# Patient Record
Sex: Male | Born: 1955
Health system: Southern US, Community
[De-identification: ages and names within clinical notes are randomized; demographics above are authoritative.]

## PROBLEM LIST (undated history)

## (undated) DIAGNOSIS — J40 Bronchitis, not specified as acute or chronic: Secondary | ICD-10-CM

## (undated) DIAGNOSIS — R12 Heartburn: Secondary | ICD-10-CM

## (undated) DIAGNOSIS — N481 Balanitis: Secondary | ICD-10-CM

## (undated) DIAGNOSIS — G4730 Sleep apnea, unspecified: Secondary | ICD-10-CM

## (undated) HISTORY — PX: TONSILLECTOMY AND ADENOIDECTOMY: SHX28

## (undated) HISTORY — DX: Sleep apnea, unspecified: G47.30

## (undated) HISTORY — DX: Balanitis: N48.1

## (undated) HISTORY — DX: Heartburn: R12

---

## 2004-06-02 ENCOUNTER — Ambulatory Visit: Payer: Self-pay | Admitting: Neurology

## 2004-07-01 ENCOUNTER — Ambulatory Visit: Payer: Self-pay | Admitting: Neurology

## 2004-10-19 ENCOUNTER — Ambulatory Visit: Payer: Self-pay

## 2011-07-14 ENCOUNTER — Emergency Department (HOSPITAL_COMMUNITY): Payer: 59

## 2011-07-14 ENCOUNTER — Encounter: Payer: Self-pay | Admitting: Emergency Medicine

## 2011-07-14 ENCOUNTER — Emergency Department (HOSPITAL_COMMUNITY)
Admission: EM | Admit: 2011-07-14 | Discharge: 2011-07-14 | Disposition: A | Discharge status: Home / Self Care | Payer: 59 | Attending: Emergency Medicine | Admitting: Emergency Medicine

## 2011-07-14 ENCOUNTER — Other Ambulatory Visit: Payer: Self-pay

## 2011-07-14 DIAGNOSIS — Y990 Civilian activity done for income or pay: Secondary | ICD-10-CM | POA: Insufficient documentation

## 2011-07-14 DIAGNOSIS — R05 Cough: Secondary | ICD-10-CM | POA: Insufficient documentation

## 2011-07-14 DIAGNOSIS — S60229A Contusion of unspecified hand, initial encounter: Secondary | ICD-10-CM | POA: Insufficient documentation

## 2011-07-14 DIAGNOSIS — S0003XA Contusion of scalp, initial encounter: Secondary | ICD-10-CM | POA: Insufficient documentation

## 2011-07-14 DIAGNOSIS — R55 Syncope and collapse: Secondary | ICD-10-CM

## 2011-07-14 DIAGNOSIS — R112 Nausea with vomiting, unspecified: Secondary | ICD-10-CM | POA: Insufficient documentation

## 2011-07-14 DIAGNOSIS — R059 Cough, unspecified: Secondary | ICD-10-CM | POA: Insufficient documentation

## 2011-07-14 DIAGNOSIS — J3489 Other specified disorders of nose and nasal sinuses: Secondary | ICD-10-CM | POA: Insufficient documentation

## 2011-07-14 DIAGNOSIS — R404 Transient alteration of awareness: Secondary | ICD-10-CM | POA: Insufficient documentation

## 2011-07-14 DIAGNOSIS — R42 Dizziness and giddiness: Secondary | ICD-10-CM | POA: Insufficient documentation

## 2011-07-14 DIAGNOSIS — M79609 Pain in unspecified limb: Secondary | ICD-10-CM | POA: Insufficient documentation

## 2011-07-14 DIAGNOSIS — S0083XA Contusion of other part of head, initial encounter: Secondary | ICD-10-CM | POA: Insufficient documentation

## 2011-07-14 DIAGNOSIS — W1809XA Striking against other object with subsequent fall, initial encounter: Secondary | ICD-10-CM | POA: Insufficient documentation

## 2011-07-14 HISTORY — DX: Bronchitis, not specified as acute or chronic: J40

## 2011-07-14 LAB — POCT I-STAT TROPONIN I: Troponin i, poc: 0 ng/mL (ref 0.00–0.08)

## 2011-07-14 LAB — DIFFERENTIAL
Basophils Absolute: 0 10*3/uL (ref 0.0–0.1)
Basophils Relative: 0 % (ref 0–1)
Eosinophils Absolute: 0.1 10*3/uL (ref 0.0–0.7)
Monocytes Relative: 8 % (ref 3–12)
Neutro Abs: 10.7 10*3/uL — ABNORMAL HIGH (ref 1.7–7.7)
Neutrophils Relative %: 84 % — ABNORMAL HIGH (ref 43–77)

## 2011-07-14 LAB — POCT I-STAT, CHEM 8
BUN: 15 mg/dL (ref 6–23)
Calcium, Ion: 1.11 mmol/L — ABNORMAL LOW (ref 1.12–1.32)
Chloride: 106 meq/L (ref 96–112)
Creatinine, Ser: 1 mg/dL (ref 0.50–1.35)
Glucose, Bld: 102 mg/dL — ABNORMAL HIGH (ref 70–99)
HCT: 40 % (ref 39.0–52.0)
Hemoglobin: 13.6 g/dL (ref 13.0–17.0)
Potassium: 4.3 mEq/L (ref 3.5–5.1)
Sodium: 142 meq/L (ref 135–145)
TCO2: 27 mmol/L (ref 0–100)

## 2011-07-14 LAB — CBC
Hemoglobin: 13.9 g/dL (ref 13.0–17.0)
MCH: 31.4 pg (ref 26.0–34.0)
MCHC: 35.4 g/dL (ref 30.0–36.0)
Platelets: 177 10*3/uL (ref 150–400)
RDW: 12.2 % (ref 11.5–15.5)

## 2011-07-14 MED ORDER — PROMETHAZINE HCL 25 MG/ML IJ SOLN
12.5000 mg | Freq: Once | INTRAMUSCULAR | Status: AC
  Administered 2011-07-14: 12.5 mg via INTRAVENOUS
  Filled 2011-07-14 (×2): qty 1

## 2011-07-14 MED ORDER — HYDROCODONE-ACETAMINOPHEN 5-325 MG PO TABS: 2.0000 | ORAL_TABLET | ORAL | Status: AC | PRN

## 2011-07-14 MED ORDER — SODIUM CHLORIDE 0.9 % IV SOLN
Freq: Once | INTRAVENOUS | Status: AC
  Administered 2011-07-14: 11:00:00 via INTRAVENOUS

## 2011-07-14 MED ORDER — ONDANSETRON HCL 4 MG/2ML IJ SOLN
INTRAMUSCULAR | Status: AC
  Administered 2011-07-14: 10:00:00
  Filled 2011-07-14: qty 2

## 2011-07-14 MED ORDER — ONDANSETRON HCL 4 MG/2ML IJ SOLN
INTRAMUSCULAR | Status: AC
  Administered 2011-07-14: 4 mg
  Filled 2011-07-14: qty 2

## 2011-07-14 MED ORDER — PROMETHAZINE HCL 25 MG PO TABS: 25.0000 mg | ORAL_TABLET | Freq: Four times a day (QID) | ORAL | Status: AC | PRN

## 2011-07-14 MED ORDER — SODIUM CHLORIDE 0.9 % IV BOLUS (SEPSIS)
1000.0000 mL | Freq: Once | INTRAVENOUS | Status: AC
  Administered 2011-07-14: 1000 mL via INTRAVENOUS

## 2011-07-14 MED ORDER — MORPHINE SULFATE 4 MG/ML IJ SOLN
4.0000 mg | Freq: Once | INTRAMUSCULAR | Status: AC
  Administered 2011-07-14: 4 mg via INTRAVENOUS
  Filled 2011-07-14: qty 1

## 2011-07-14 NOTE — ED Notes (Signed)
Pt resting quietly with eyes closed, resp equal/nonlaboed.  Easily roused, warm blankets given.

## 2011-07-14 NOTE — ED Notes (Signed)
Wound care performed by NT [place] with saline.

## 2011-07-14 NOTE — ED Notes (Signed)
Pt resting semi fowlers position.  VSS, he denies N/V at this time.  PO trial attempted with pt who was given Sprite and crackers.

## 2011-07-14 NOTE — ED Provider Notes (Signed)
I have personally performed and participated in all the services and procedures documented herein. I have reviewed the findings with the patient.  Pt with recent illness, had not eaten or drank much fluid today, poor appetite, pt had no preceding CP, pleuritic pain, back or flank pain, continues to have no pain.  Briefly slumped over on desk.  Pt clinically improved with IVF's here.  Pt safe to be discharged home with close outpt follow up.  Gavin Pound. Oletta Lamas, MD 07/14/11 [phone]

## 2011-07-14 NOTE — ED Notes (Signed)
Per EMS:  Pt has had three episodes of emesis this morning.  Pt stated he has not had anything to eat today and is taking antibiotics for bronchitis.  He is unsure of what he is taking but stated he thinks it is a Z-pack, a nasal spray, and a cough syrup containing codeine.

## 2011-07-14 NOTE — ED Notes (Signed)
Pt c/o persistent nausea without emesis and headache.  PA notified.

## 2011-07-14 NOTE — ED Provider Notes (Signed)
History     CSN: [id number] Arrival date & time: 07/14/2011  9:06 AM   First MD Initiated Contact with Patient 07/14/11 0919      Chief Complaint  Patient presents with  . Loss of Consciousness    (Consider location/radiation/quality/duration/timing/severity/associated sxs/prior treatment) Patient is a 55 y.o. male presenting with syncope. The history is provided by the patient and a friend.  Loss of Consciousness This is a new problem. The current episode started today. Associated symptoms include congestion, coughing, nausea and vomiting. Pertinent negatives include no abdominal pain, anorexia, arthralgias, change in bowel habit, chest pain, chills, diaphoresis, fatigue, fever, headaches, joint swelling, myalgias, neck pain, numbness, rash, sore throat, swollen glands, urinary symptoms, vertigo, visual change or weakness. He has tried nothing for the symptoms.  He is currently being treated for bronchitis by his PCP; he was started on a Z-pack yesterday. He states that he took his medication this morning but had not eaten breakfast. He was at work this morning when he began to feel poorly - he felt a bit lightheaded. He denies associated CP, SOB, diaphoresis, n/v. He sat down at his desk and put his head down and apparently passed out. Coworkers did not actually see him pass out but heard him hit the ground; he was out for about 30 seconds. Witnesses report that he did not have any seizure like activity. He came to and was A+O and was able to talk with coworkers. He became nauseated after the event and vomited prior to ED arrival; he had persistent nausea upon arrival to the ED.  Past Medical History  Diagnosis Date  . Bronchitis     History reviewed. No pertinent past surgical history.  History reviewed. No pertinent family history.  History  Substance Use Topics  . Smoking status: Not on file  . Smokeless tobacco: Not on file  . Alcohol Use: No      Review of Systems    Constitutional: Negative for fever, chills, diaphoresis and fatigue.  HENT: Positive for congestion. Negative for ear pain, sore throat, neck pain and tinnitus.   Eyes: Negative for photophobia and visual disturbance.  Respiratory: Positive for cough. Negative for chest tightness, shortness of breath and wheezing.   Cardiovascular: Positive for syncope. Negative for chest pain.  Gastrointestinal: Positive for nausea and vomiting. Negative for abdominal pain, diarrhea, blood in stool, anorexia and change in bowel habit.  Musculoskeletal: Negative for myalgias, joint swelling and arthralgias.  Skin: Negative for color change and rash.  Neurological: Positive for syncope. Negative for dizziness, vertigo, tremors, seizures, facial asymmetry, speech difficulty, weakness, light-headedness, numbness and headaches.  Psychiatric/Behavioral: Negative for confusion.    Allergies  Penicillins  Home Medications  No current outpatient prescriptions on file.  BP 96/59  Pulse 61  Temp(Src) 97.7 F (36.5 C) (Oral)  Resp 15  SpO2 97%  Physical Exam  Nursing note and vitals reviewed. Constitutional: He is oriented to person, place, and time. He appears well-developed and well-nourished. No distress.  HENT:  Head: Normocephalic.  Right Ear: External ear normal.  Left Ear: External ear normal.  Nose: Nose normal.  Mouth/Throat: Oropharynx is clear and moist. No oropharyngeal exudate.       Small scalp hematoma noted just into hairline on R side of head. Abrasion and bruising lateral to R eye - pt says he wears glasses which caused the abrasion.  Eyes: Conjunctivae and EOM are normal. Pupils are equal, round, and reactive to light.  Neck: Normal range of  motion. Neck supple.  Cardiovascular: Normal rate, regular rhythm and normal heart sounds.  Exam reveals no gallop and no friction rub.   No murmur heard. Pulmonary/Chest: Effort normal and breath sounds normal. No respiratory distress. He has no  wheezes. He has no rales. He exhibits no tenderness.  Abdominal: Soft. Bowel sounds are normal. There is no tenderness. There is no guarding.  Musculoskeletal: Normal range of motion. He exhibits tenderness.       Bruising noted to MCP jt of L hand. Area is slightly ttp.  Neurological: He is alert and oriented to person, place, and time. No cranial nerve deficit. He exhibits normal muscle tone.  Skin: Skin is warm and dry. No rash noted. He is not diaphoretic.  Psychiatric: He has a normal mood and affect.    ED Course  Procedures (including critical care time)  Labs Reviewed  CBC - Abnormal; Notable for the following:    WBC 12.8 (*)    All other components within normal limits  DIFFERENTIAL - Abnormal; Notable for the following:    Neutrophils Relative 84 (*)    Neutro Abs 10.7 (*)    Lymphocytes Relative 8 (*)    All other components within normal limits  POCT I-STAT, CHEM 8 - Abnormal; Notable for the following:    Glucose, Bld 102 (*)    Calcium, Ion 1.11 (*)    All other components within normal limits  POCT I-STAT TROPONIN I  I-STAT TROPONIN I  I-STAT, CHEM 8  Minor WBC which I suspect is from his viral illness. Enzymes negative.  Dg Chest 2 View  07/14/2011  *RADIOLOGY REPORT*  Clinical Data: Syncopal episode, fell hitting head  CHEST - 2 VIEW  Comparison: None.  Findings: The lungs are clear although slightly hyperaerated. Mediastinal contours appear normal.  The heart is within normal limits in size.  No bony abnormality is seen.  IMPRESSION: No active lung disease.  Slight hyperaeration.  Original Report Authenticated By: Juline Patch, M.D.   Ct Head Wo Contrast  07/14/2011  *RADIOLOGY REPORT*  Clinical Data: Loss of consciousness.  CT HEAD WITHOUT CONTRAST  Technique:  Contiguous axial images were obtained from the base of the skull through the vertex without contrast.  Comparison: None  Findings: The ventricles are normal.  No extra-axial fluid collections are seen.   The brainstem and cerebellum are unremarkable.  No acute intracranial findings such as infarction or hemorrhage.  No mass lesions.  The bony calvarium is intact.  The visualized paranasal sinuses and mastoid air cells are clear.  IMPRESSION: No acute intracranial findings or skull fracture.  Original Report Authenticated By: P. Loralie Champagne, M.D.   Dg Hand 2 View Left  07/14/2011  *RADIOLOGY REPORT*  Clinical Data: Pain and swelling.  LEFT HAND - 2 VIEW  Comparison: None  Findings: The joint spaces are maintained.  No significant degenerative changes.  No acute bony findings.  IMPRESSION: No acute bony findings.  Original Report Authenticated By: P. Loralie Champagne, M.D.     Date: 07/14/2011  Rate: 60  Rhythm: normal sinus rhythm  QRS Axis: normal  Intervals: normal  ST/T Wave abnormalities: nonspecific ST changes  Conduction Disutrbances:none  Narrative Interpretation: diffuse nonspecific ST changes  Old EKG Reviewed: none available    1. Syncope       MDM  12:05 PM Patient seen and assessed. Small abrasion noted to face. A+Ox4. NAD. Neuro exam nonfocal, physical exam nl. Will obtain CT head as patient does have  scalp hematoma and has had persistent nausea and vomiting since the incident.  1:25 PM Patient's labs unremarkable. CXR and CT head nl. Patient states he feels somewhat better, not orthostatic. Will try PO trial.  2:00 PM Patient able to tolerate PO liquids but began to feel nauseous when beginning to eat crackers. Will remedicate with Phenergan and morphine.  3:31 PM Patient resting much more comfortably s/p phenergan. Will plan to d/c home at this point with pain medication. Workup essentially negative for acute worrisome etiology of syncope; enzymes, EKG nl, CT head nl, other labs nl. I suspect this may be related to being slightly dehydrated with his URI/bronchitis and taking the abx on an empty stomach. I explained the findings and plan to pt and wife; he was  instructed to keep a f/u appt that has already been made with an NP at his PCP's office. He was instructed re: warning signs that would prompt a return visit. They verbalized understanding and agreed to plan.    Grant Fontana, [place] 07/14/11 1640

## 2011-07-14 NOTE — ED Notes (Signed)
Physician assistant at [facility] for evaluation.

## 2011-07-14 NOTE — ED Notes (Signed)
Per EMS:  Pt was at work sitting down and co-workers saw him pass out landing face first on the floor

## 2011-07-14 NOTE — ED Notes (Signed)
Pt reports that he was at work when syncope occurred.  He does state that he vaguely remembers episode.  Coworkers report that he was unresponsive for approximately 1 minute.  Pt vommitted prior to ER arrival and c/o nausea upon arrival.  Emesis bag given.  Pt A?O x3, NAD, resp equal/nonlabored.  Pt denies dizziness and visual disturbance at this time.

## 2011-12-24 ENCOUNTER — Ambulatory Visit: Payer: Self-pay | Admitting: Family Medicine

## 2012-02-01 ENCOUNTER — Ambulatory Visit: Payer: Self-pay

## 2013-08-15 ENCOUNTER — Ambulatory Visit: Payer: Self-pay | Admitting: Family Medicine

## 2014-07-21 ENCOUNTER — Emergency Department: Payer: Self-pay | Admitting: Emergency Medicine

## 2014-10-23 LAB — CBC AND DIFFERENTIAL
HCT: 45 % (ref 41–53)
Hemoglobin: 16.3 g/dL (ref 13.5–17.5)
Neutrophils Absolute: 5 /uL
Platelets: 240 10*3/uL (ref 150–399)
WBC: 8.2 10^3/mL

## 2014-10-23 LAB — LIPID PANEL
Cholesterol: 222 mg/dL — AB (ref 0–200)
HDL: 46 mg/dL (ref 35–70)
LDL Cholesterol: 139 mg/dL
LDl/HDL Ratio: 3
Triglycerides: 186 mg/dL — AB (ref 40–160)

## 2014-10-23 LAB — HEPATIC FUNCTION PANEL
ALK PHOS: 64 U/L (ref 25–125)
ALT: 10 U/L (ref 10–40)
AST: 15 U/L (ref 14–40)
Bilirubin, Total: 0.8 mg/dL

## 2014-10-23 LAB — BASIC METABOLIC PANEL
BUN: 17 mg/dL (ref 4–21)
Creatinine: 1 mg/dL (ref 0.6–1.3)
GLUCOSE: 90 mg/dL
POTASSIUM: 4.3 mmol/L (ref 3.4–5.3)
SODIUM: 143 mmol/L (ref 137–147)

## 2014-10-23 LAB — PSA: PSA: 1.7

## 2014-10-23 LAB — TSH: TSH: 3.06 u[IU]/mL (ref 0.41–5.90)

## 2015-02-12 DIAGNOSIS — E291 Testicular hypofunction: Secondary | ICD-10-CM | POA: Insufficient documentation

## 2015-02-12 DIAGNOSIS — R55 Syncope and collapse: Secondary | ICD-10-CM | POA: Insufficient documentation

## 2015-02-12 DIAGNOSIS — K219 Gastro-esophageal reflux disease without esophagitis: Secondary | ICD-10-CM | POA: Insufficient documentation

## 2015-02-12 DIAGNOSIS — E299 Testicular dysfunction, unspecified: Secondary | ICD-10-CM | POA: Insufficient documentation

## 2015-02-12 DIAGNOSIS — F329 Major depressive disorder, single episode, unspecified: Secondary | ICD-10-CM | POA: Insufficient documentation

## 2015-02-12 DIAGNOSIS — E785 Hyperlipidemia, unspecified: Secondary | ICD-10-CM | POA: Insufficient documentation

## 2015-02-12 DIAGNOSIS — G4700 Insomnia, unspecified: Secondary | ICD-10-CM | POA: Insufficient documentation

## 2015-02-12 DIAGNOSIS — K449 Diaphragmatic hernia without obstruction or gangrene: Secondary | ICD-10-CM | POA: Insufficient documentation

## 2015-02-12 DIAGNOSIS — G4733 Obstructive sleep apnea (adult) (pediatric): Secondary | ICD-10-CM | POA: Insufficient documentation

## 2015-02-12 DIAGNOSIS — R0683 Snoring: Secondary | ICD-10-CM | POA: Insufficient documentation

## 2015-02-12 DIAGNOSIS — E559 Vitamin D deficiency, unspecified: Secondary | ICD-10-CM | POA: Insufficient documentation

## 2015-02-12 DIAGNOSIS — G500 Trigeminal neuralgia: Secondary | ICD-10-CM | POA: Insufficient documentation

## 2015-02-12 DIAGNOSIS — F32A Depression, unspecified: Secondary | ICD-10-CM | POA: Insufficient documentation

## 2015-02-12 DIAGNOSIS — Z7251 High risk heterosexual behavior: Secondary | ICD-10-CM | POA: Insufficient documentation

## 2015-02-12 DIAGNOSIS — A6000 Herpesviral infection of urogenital system, unspecified: Secondary | ICD-10-CM | POA: Insufficient documentation

## 2015-02-12 DIAGNOSIS — N529 Male erectile dysfunction, unspecified: Secondary | ICD-10-CM | POA: Insufficient documentation

## 2015-02-12 DIAGNOSIS — H81319 Aural vertigo, unspecified ear: Secondary | ICD-10-CM | POA: Insufficient documentation

## 2015-02-12 DIAGNOSIS — F320 Major depressive disorder, single episode, mild: Secondary | ICD-10-CM | POA: Insufficient documentation

## 2015-02-14 ENCOUNTER — Encounter: Payer: Self-pay | Admitting: Physician Assistant

## 2015-02-14 ENCOUNTER — Ambulatory Visit (INDEPENDENT_AMBULATORY_CARE_PROVIDER_SITE_OTHER): Payer: 59 | Admitting: Physician Assistant

## 2015-02-14 VITALS — BP 104/70 | HR 62 | Temp 97.5°F | Resp 16 | Wt 178.8 lb

## 2015-02-14 DIAGNOSIS — J302 Other seasonal allergic rhinitis: Secondary | ICD-10-CM | POA: Diagnosis not present

## 2015-02-14 DIAGNOSIS — R059 Cough, unspecified: Secondary | ICD-10-CM

## 2015-02-14 DIAGNOSIS — R05 Cough: Secondary | ICD-10-CM | POA: Diagnosis not present

## 2015-02-14 MED ORDER — PREDNISONE 10 MG PO TABS: ORAL_TABLET | ORAL | Status: DC

## 2015-02-14 MED ORDER — LORATADINE 10 MG PO TABS: 10.0000 mg | ORAL_TABLET | Freq: Every day | ORAL | Status: DC

## 2015-02-14 NOTE — Progress Notes (Signed)
Subjective:    Patient ID: Danny Ortiz, male    DOB: [birth year], 59 y.o.   MRN: [id number]  Cough This is a recurrent problem. The current episode started more than 1 month ago. The problem has been unchanged. The cough is non-productive. Associated symptoms include nasal congestion, postnasal drip, rhinorrhea and shortness of breath. Pertinent negatives include no chest pain, chills, ear congestion, ear pain, eye redness, fever, headaches, heartburn, hemoptysis, myalgias, rash, sore throat, sweats, weight loss or wheezing. The symptoms are aggravated by lying down. He has tried nothing for the symptoms. His past medical history is significant for asthma, bronchitis and environmental allergies. There is no history of bronchiectasis, COPD, emphysema or pneumonia.      Review of Systems  Constitutional: Negative for fever, chills, weight loss, activity change, appetite change, fatigue and unexpected weight change.  HENT: Positive for congestion, postnasal drip, rhinorrhea, sinus pressure and sneezing. Negative for ear discharge, ear pain, nosebleeds, sore throat, tinnitus, trouble swallowing and voice change.   Eyes: Negative for photophobia, discharge and redness.  Respiratory: Positive for cough, chest tightness and shortness of breath. Negative for hemoptysis and wheezing.   Cardiovascular: Negative for chest pain, palpitations and leg swelling.  Gastrointestinal: Negative for heartburn, nausea, vomiting, diarrhea and constipation.  Musculoskeletal: Negative for myalgias, back pain, arthralgias, neck pain and neck stiffness.  Skin: Negative for color change and rash.  Allergic/Immunologic: Positive for environmental allergies.  Neurological: Negative for dizziness, seizures, syncope, weakness, light-headedness and headaches.       Objective:   Physical Exam  Constitutional: He appears well-developed and well-nourished. No distress.  HENT:  Head: Normocephalic and atraumatic.   Right Ear: Hearing and external ear normal.  Left Ear: Hearing and external ear normal.  Nose: Mucosal edema and rhinorrhea present. No sinus tenderness. Right sinus exhibits no maxillary sinus tenderness and no frontal sinus tenderness. Left sinus exhibits no maxillary sinus tenderness and no frontal sinus tenderness.  Mouth/Throat: Uvula is midline, oropharynx is clear and moist and mucous membranes are normal. No oropharyngeal exudate.  (B) ears with excessive cerumen; Nasal passageways were enlarged and pale  Eyes: Conjunctivae are normal. Pupils are equal, round, and reactive to light. Right eye exhibits no discharge. Left eye exhibits no discharge. No scleral icterus.  Neck: Normal range of motion. Neck supple. No JVD present. No tracheal deviation present. No thyromegaly present.  Cardiovascular: Normal rate, regular rhythm, normal heart sounds and intact distal pulses.  Exam reveals no gallop and no friction rub.   No murmur heard. Pulmonary/Chest: Effort normal and breath sounds normal. No stridor. No respiratory distress. He has no wheezes. He has no rales. He exhibits no tenderness.  Lymphadenopathy:    He has no cervical adenopathy.  Skin: He is not diaphoretic.  Vitals reviewed.         Assessment & Plan:  1. Cough He preferred to try a prednisone taper over an inhaler due to ease of use.  Will try 6 day taper.  If no relief will get CXR and possibly try zpak with a second taper. - predniSONE (DELTASONE) 10 MG tablet; Take 6 tablets on day 1, 5 tablets on day 2, 4 tablets on day 3, 3 tablets on day 4, 2 tablets on day 5 and 1 tablet on day 6  Dispense: 21 tablet; Refill: 0  2. Seasonal allergies Uncontrolled.  Will add loratadine back to allergy regimen.  Advised to continue flonase.  If still with breakthrough allergy symptoms will add  singulair. - loratadine (CLARITIN) 10 MG tablet; Take 1 tablet (10 mg total) by mouth daily.  Dispense: 30 tablet; Refill: 3

## 2015-02-14 NOTE — Patient Instructions (Signed)

## 2015-04-08 ENCOUNTER — Other Ambulatory Visit: Payer: Self-pay | Admitting: Family Medicine

## 2015-05-02 ENCOUNTER — Telehealth: Payer: Self-pay | Admitting: Family Medicine

## 2015-05-02 NOTE — Telephone Encounter (Signed)
Spoke with patient and made appt for Tuesday to discuss Valtrex medication and rash issue-aa

## 2015-05-02 NOTE — Telephone Encounter (Signed)
Pt called wants to discuss one of the medications with someone.  He does not think it is working anymore.  He would like some one to call him today if possible.  Call back is  [phone].  Thanks [employer]

## 2015-05-06 ENCOUNTER — Encounter: Payer: Self-pay | Admitting: Family Medicine

## 2015-05-06 ENCOUNTER — Ambulatory Visit (INDEPENDENT_AMBULATORY_CARE_PROVIDER_SITE_OTHER): Payer: 59 | Admitting: Family Medicine

## 2015-05-06 VITALS — BP 102/60 | HR 74 | Temp 97.6°F | Resp 16 | Wt 177.0 lb

## 2015-05-06 DIAGNOSIS — A6000 Herpesviral infection of urogenital system, unspecified: Secondary | ICD-10-CM

## 2015-05-06 MED ORDER — ACYCLOVIR 5 % EX CREA: 1.0000 "application " | TOPICAL_CREAM | CUTANEOUS | Status: DC

## 2015-05-06 MED ORDER — VALACYCLOVIR HCL 500 MG PO TABS: 500.0000 mg | ORAL_TABLET | Freq: Three times a day (TID) | ORAL | Status: DC

## 2015-05-06 NOTE — Progress Notes (Signed)
Patient ID: Danny Ortiz, male   DOB: Aug 08, 1956, 59 y.o.   MRN: [id number]    Subjective:  HPI Pt reports that he has a rash on his penis and has been going on for about 6 months. He reports that the rash is not raised up and is not pimple like. It is just a red rash. Does not itch but does have a little dull ache. He does have a history of herpes but reports that this does not look like herpes and he has taken Valtrex and it has not worked. Denies dysuria or any urinary symptoms.   Prior to Admission medications   Medication Sig Start Date End Date Taking? Authorizing Provider  fluticasone (FLONASE) 50 MCG/ACT nasal spray Place 2 sprays into the nose daily.     Yes Historical Provider, MD  omeprazole (PRILOSEC) 20 MG capsule Take 20 mg by mouth daily.     Yes Historical Provider, MD  sildenafil (REVATIO) 20 MG tablet Take 20-100 mg by mouth daily as needed.   Yes Historical Provider, MD  valACYclovir (VALTREX) 500 MG tablet TAKE 1 TABLET BY MOUTH TWICE A DAY FOR OUTBREAK 04/08/15  Yes Jerrol Banana., MD  zolpidem (AMBIEN) 10 MG tablet Take 5 mg by mouth at bedtime as needed for sleep.   Yes Historical Provider, MD  loratadine (CLARITIN) 10 MG tablet Take 1 tablet (10 mg total) by mouth daily. Patient not taking: Reported on 05/06/2015 02/14/15   Mar Daring, PA-C    Patient Active Problem List   Diagnosis Date Noted  . Seasonal allergies 02/14/2015  . Clinical depression 02/12/2015  . Testicular dysfunction 02/12/2015  . ED (erectile dysfunction) of organic origin 02/12/2015  . Acid reflux 02/12/2015  . Genital herpes 02/12/2015  . 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 High risk sexual behavior 02/12/2015  . HLD (hyperlipidemia) 02/12/2015  . 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 Cannot sleep 02/12/2015  . Auditory vertigo 02/12/2015  . Mild major depression 02/12/2015  . Obstructive apnea 02/12/2015  . Snores 02/12/2015  . Episode of syncope 02/12/2015  . Trigeminal neuralgia  02/12/2015  . Avitaminosis D 02/12/2015    Past Medical History  Diagnosis Date  . Bronchitis     Social History   Social History  . Marital Status: Married    Spouse Name: N/A  . Number of Children: N/A  . Years of Education: N/A   Occupational History  . Not on file.   Social History Main Topics  . Smoking status: Never Smoker   . Smokeless tobacco: Not on file  . Alcohol Use: 0.0 oz/week    0 Standard drinks or equivalent per week     Comment: OCCASIONALLY  . Drug Use: No  . Sexual Activity: No   Other Topics Concern  . Not on file   Social History Narrative    Allergies  Allergen Reactions  . Penicillins Nausea And Vomiting  . Wellbutrin [Bupropion] Other (See Comments)    Jittery/shakes, dry mouth, felt bad    Review of Systems  Constitutional: Negative.   HENT: Negative.   Eyes: Negative.   Respiratory: Negative.   Cardiovascular: Negative.   Gastrointestinal: Negative.   Genitourinary: Negative.   Musculoskeletal: Negative.   Skin: Positive for rash (on penis).  Neurological: Negative.   Endo/Heme/Allergies: Negative.   Psychiatric/Behavioral: Negative.     Immunization History  Administered Date(s) Administered  . Td 09/11/2004  . Tdap 10/10/2014   Objective:  BP 102/60 mmHg  Pulse 74  Temp(Src) 97.6 F (36.4 C) (Oral)  Resp 16  Wt 177 lb (80.287 kg)  Physical Exam  Constitutional: He is well-developed, well-nourished, and in no distress.  HENT:  Head: Normocephalic and atraumatic.  Right Ear: External ear normal.  Left Ear: External ear normal.  Nose: Nose normal.  Eyes: Conjunctivae and EOM are normal. Pupils are equal, round, and reactive to light.  Neck: Normal range of motion. Neck supple.  Cardiovascular: Normal rate, regular rhythm, normal heart sounds and intact distal pulses.   Pulmonary/Chest: Effort normal and breath sounds normal.  Abdominal:  No inguinal adenopathy.  Genitourinary: Penis normal.  On the other on  the underside of the shaft of the penis just below the glans penis is a mildly erythematous, nonindurated area that is non-papular nonvesicular. It has the appearance of what I've seen in the past of a healing herpetic lesion. It is not Monilial.    Lab Results  Component Value Date   WBC 12.8* 07/14/2011   HGB 13.6 07/14/2011   HCT 40.0 07/14/2011   PLT 177 07/14/2011   GLUCOSE 102* 07/14/2011    CMP     Component Value Date/Time   NA 142 07/14/2011 1022   K 4.3 07/14/2011 1022   CL 106 07/14/2011 1022   GLUCOSE 102* 07/14/2011 1022   BUN 15 07/14/2011 1022   CREATININE 1.00 07/14/2011 1022    Assessment and Plan :  1. Herpes genitalia Most likely this is a healing herpetic outbreak. Increase Valtrex to 3 times a day for a week and refill the topical acyclovir as he says it helps him symptomatically. I'm not sure why this was helped symptoms but it is safe enough to do so. Go ahead and refer to dermatology as patient is worried about this issue. He denies significant or aggressive sexual encounters. No sign of STI. - valACYclovir (VALTREX) 500 MG tablet; Take 1 tablet (500 mg total) by mouth 3 (three) times daily.  Dispense: 30 tablet; Refill: 12 - acyclovir cream (ZOVIRAX) 5 %; Apply 1 application topically every 3 (three) hours. PRN  Dispense: 45 g; Refill: 1  2. Depression Patient is grieving the loss of his marriage. He has put off the divorce as long as he could. This will occur next spring. I think after that he will begin to recover. No suicidal or homicidal ideation. He states the depression is fine.  Miguel Aschoff MD [facility] 05/06/2015 9:29 AM

## 2015-05-07 NOTE — Telephone Encounter (Signed)
There is no generic to my knowledge for the cream. You can try generic acyclovir to be sent in. Thanks

## 2015-05-07 NOTE — Telephone Encounter (Signed)
Patient advised per Dr. Rosanna Randy that Acyclovir generic was the medication sent to pharmacy on 9/6. Patient states medication is $650 out of pocket. Advised patient that Valtrex is the same treatment per Dr. Rosanna Randy to continue with the Valtrex.

## 2015-05-07 NOTE — Telephone Encounter (Signed)
Pt states that medication that was called in for rash was to expensive.Can you call in generic for it ? If not pt wants a different medication called into [facility] [address]

## 2015-06-15 ENCOUNTER — Other Ambulatory Visit: Payer: Self-pay | Admitting: Family Medicine

## 2015-06-16 NOTE — Telephone Encounter (Signed)
Controlled, need you to authorize  thanks

## 2015-09-04 ENCOUNTER — Other Ambulatory Visit: Payer: Self-pay

## 2015-10-22 ENCOUNTER — Encounter: Payer: Self-pay | Admitting: Family Medicine

## 2015-11-19 ENCOUNTER — Encounter: Payer: Self-pay | Admitting: Family Medicine

## 2015-11-19 ENCOUNTER — Ambulatory Visit (INDEPENDENT_AMBULATORY_CARE_PROVIDER_SITE_OTHER): Payer: 59 | Admitting: Family Medicine

## 2015-11-19 VITALS — BP 102/58 | HR 64 | Temp 97.5°F | Resp 18 | Ht 70.0 in | Wt 181.0 lb

## 2015-11-19 DIAGNOSIS — Z0000 Encounter for general adult medical examination without abnormal findings: Secondary | ICD-10-CM

## 2015-11-19 DIAGNOSIS — N481 Balanitis: Secondary | ICD-10-CM | POA: Diagnosis not present

## 2015-11-19 DIAGNOSIS — E291 Testicular hypofunction: Secondary | ICD-10-CM

## 2015-11-19 LAB — POCT URINALYSIS DIPSTICK
Bilirubin, UA: NEGATIVE
GLUCOSE UA: NEGATIVE
Ketones, UA: NEGATIVE
LEUKOCYTES UA: NEGATIVE
NITRITE UA: NEGATIVE
Protein, UA: NEGATIVE
Spec Grav, UA: 1.02
UROBILINOGEN UA: 0.2
pH, UA: 6.5

## 2015-11-19 NOTE — Progress Notes (Signed)
Patient ID: Danny Ortiz, male   DOB: 07/20/1956, 60 y.o.   MRN: [id number]       Patient: Danny Ortiz, Male    DOB: 27-Apr-1956, 60 y.o.   MRN: [id number] Visit Date: 11/19/2015  Today's Provider: Wilhemena Durie, MD   Chief Complaint  Patient presents with  . Annual Exam   Subjective:    Annual physical exam Danny Ortiz is a 60 y.o. male who presents today for health maintenance and complete physical. He feels fairly well. He reports he is not exercising. He reports he is sleeping fairly well with 1/2 of Ambien. Finalizing divorce with wife. Patient has a son and daughter. Both are doing okay. He continues to work full-time. ----------------------------------------------------------------- Colonoscopy- 10/19/14 normal repeat 10 years Endoscopy- 10/19/14 hiatal hernia  Current Exercise Habits: The patient does not participate in regular exercise at present    He wants to wait on the Zoster vaccine, he has a 37 month old granddaughter.    Immunization History  Administered Date(s) Administered  . Td 09/11/2004  . Tdap 10/10/2014     Review of Systems  Constitutional: Negative.   HENT: Positive for sneezing.   Eyes: Negative.   Respiratory: Negative.   Cardiovascular: Negative.   Gastrointestinal: Negative.   Endocrine: Negative.   Genitourinary: Negative.   Musculoskeletal: Positive for arthralgias.  Skin: Negative.   Allergic/Immunologic: Negative.   Neurological: Negative.   Hematological: Negative.   Psychiatric/Behavioral: Negative.     Social History      He  reports that he has never smoked. He does not have any smokeless tobacco history on file. He reports that he drinks alcohol. He reports that he does not use illicit drugs.       Social History   Social History  . Marital Status: Married    Spouse Name: N/A  . Number of Children: N/A  . Years of Education: N/A   Social History Main Topics  . Smoking status: Never Smoker   . Smokeless  tobacco: None  . Alcohol Use: 0.0 oz/week    0 Standard drinks or equivalent per week     Comment: OCCASIONALLY  . Drug Use: No  . Sexual Activity: No   Other Topics Concern  . None   Social History Narrative    Past Medical History  Diagnosis Date  . Bronchitis      Patient Active Problem List   Diagnosis Date Noted  . Seasonal allergies 02/14/2015  . Clinical depression 02/12/2015  . Testicular dysfunction 02/12/2015  . ED (erectile dysfunction) of organic origin 02/12/2015  . Acid reflux 02/12/2015  . Genital herpes 02/12/2015  . 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 High risk sexual behavior 02/12/2015  . HLD (hyperlipidemia) 02/12/2015  . 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 Cannot sleep 02/12/2015  . Auditory vertigo 02/12/2015  . Mild major depression ([place]) 02/12/2015  . Obstructive apnea 02/12/2015  . Snores 02/12/2015  . Episode of syncope 02/12/2015  . Trigeminal neuralgia 02/12/2015  . Avitaminosis D 02/12/2015    Past Surgical History  Procedure Laterality Date  . Tonsillectomy and adenoidectomy      Family History        Family Status  Relation Status Death Age  . Mother Alive   . Father Alive   . Brother Alive   . Brother Alive   . Brother Alive         His family history includes Colon polyps in his maternal grandmother; Heart disease in his mother.  Allergies  Allergen Reactions  . Penicillins Nausea And Vomiting  . Wellbutrin [Bupropion] Other (See Comments)    Jittery/shakes, dry mouth, felt bad    Previous Medications   ACYCLOVIR CREAM (ZOVIRAX) 5 %    Apply 1 application topically every 3 (three) hours. PRN   FLUTICASONE (FLONASE) 50 MCG/ACT NASAL SPRAY    Place 2 sprays into the nose daily.     LORATADINE (CLARITIN) 10 MG TABLET    Take 1 tablet (10 mg total) by mouth daily.   OMEPRAZOLE (PRILOSEC) 20 MG CAPSULE    Take 20 mg by mouth daily.     SILDENAFIL (REVATIO) 20 MG TABLET    Take 20-100 mg by mouth daily as needed.   VALACYCLOVIR  (VALTREX) 500 MG TABLET    Take 1 tablet (500 mg total) by mouth 3 (three) times daily.   ZOLPIDEM (AMBIEN) 10 MG TABLET    TAKE 1/2 TABLET BY MOUTH AT BEDTIME    Patient Care Team: Jerrol Banana., MD as PCP - General (Family Medicine)     Objective:   Vitals: There were no vitals taken for this visit.   Physical Exam  Constitutional: He is oriented to person, place, and time. He appears well-developed and well-nourished.  HENT:  Head: Normocephalic and atraumatic.  Right Ear: External ear normal.  Left Ear: External ear normal.  Nose: Nose normal.  Mouth/Throat: Oropharynx is clear and moist.  Mild bilateral cerumen impaction.  Eyes: Conjunctivae and EOM are normal. Pupils are equal, round, and reactive to light.  Neck: Normal range of motion. Neck supple.  Cardiovascular: Normal rate, regular rhythm, normal heart sounds and intact distal pulses.   Pulmonary/Chest: Effort normal and breath sounds normal.  Abdominal: Soft. Bowel sounds are normal.  Genitourinary: Rectum normal, prostate normal and penis normal.  Musculoskeletal: Normal range of motion.  Neurological: He is alert and oriented to person, place, and time. He has normal reflexes.  Skin: Skin is warm and dry.  Psychiatric: He has a normal mood and affect. His behavior is normal. Judgment and thought content normal.     Depression Screen PHQ 2/9 Scores 05/06/2015  PHQ - 2 Score 0      Assessment & Plan:     Routine Health Maintenance and Physical Exam  Exercise Activities and Dietary recommendations Goals    None      Immunization History  Administered Date(s) Administered  . Td 09/11/2004  . Tdap 10/10/2014    Health Maintenance  Topic Date Due  . Hepatitis C Screening  [birth year]  . HIV Screening  04/15/1969  . COLONOSCOPY  04/15/2004  . ZOSTAVAX  04/15/2014  . INFLUENZA VACCINE  09/03/2016 (Originally 03/31/2015)  . TETANUS/TDAP  10/10/2024      Discussed health benefits of  physical activity, and encouraged him to engage in regular exercise appropriate for his age and condition.   I have done the exam and reviewed the above chart and it is accurate to the best of my knowledge.  --------------------------------------------------------------------

## 2015-11-19 NOTE — Patient Instructions (Signed)
For ear wax use over the counter Debrox as directed on the box.

## 2015-11-20 LAB — CBC WITH DIFFERENTIAL/PLATELET
BASOS: 1 %
Basophils Absolute: 0.1 10*3/uL (ref 0.0–0.2)
EOS (ABSOLUTE): 0.3 10*3/uL (ref 0.0–0.4)
EOS: 4 %
HEMOGLOBIN: 15 g/dL (ref 12.6–17.7)
Hematocrit: 42.6 % (ref 37.5–51.0)
IMMATURE GRANULOCYTES: 0 %
Immature Grans (Abs): 0 10*3/uL (ref 0.0–0.1)
LYMPHS ABS: 1.6 10*3/uL (ref 0.7–3.1)
Lymphs: 24 %
MCH: 32.6 pg (ref 26.6–33.0)
MCHC: 35.2 g/dL (ref 31.5–35.7)
MCV: 93 fL (ref 79–97)
MONOCYTES: 9 %
MONOS ABS: 0.6 10*3/uL (ref 0.1–0.9)
Neutrophils Absolute: 4.2 10*3/uL (ref 1.4–7.0)
Neutrophils: 62 %
Platelets: 210 10*3/uL (ref 150–379)
RBC: 4.6 x10E6/uL (ref 4.14–5.80)
RDW: 13.6 % (ref 12.3–15.4)
WBC: 6.8 10*3/uL (ref 3.4–10.8)

## 2015-11-20 LAB — COMPREHENSIVE METABOLIC PANEL
A/G RATIO: 2.3 — AB (ref 1.2–2.2)
ALBUMIN: 4.6 g/dL (ref 3.6–4.8)
ALK PHOS: 61 IU/L (ref 39–117)
ALT: 12 IU/L (ref 0–44)
AST: 17 IU/L (ref 0–40)
BUN / CREAT RATIO: 22 (ref 10–22)
BUN: 20 mg/dL (ref 8–27)
Bilirubin Total: 1 mg/dL (ref 0.0–1.2)
CHLORIDE: 102 mmol/L (ref 96–106)
CO2: 28 mmol/L (ref 18–29)
CREATININE: 0.89 mg/dL (ref 0.76–1.27)
Calcium: 9.3 mg/dL (ref 8.6–10.2)
GFR calc Af Amer: 107 mL/min/{1.73_m2} (ref >59)
GFR calc non Af Amer: 92 mL/min/{1.73_m2} (ref >59)
GLOBULIN, TOTAL: 2 g/dL (ref 1.5–4.5)
Glucose: 89 mg/dL (ref 65–99)
POTASSIUM: 4.1 mmol/L (ref 3.5–5.2)
SODIUM: 145 mmol/L — AB (ref 134–144)
Total Protein: 6.6 g/dL (ref 6.0–8.5)

## 2015-11-20 LAB — LIPID PANEL WITH LDL/HDL RATIO
Cholesterol, Total: 215 mg/dL — ABNORMAL HIGH (ref 100–199)
HDL: 41 mg/dL (ref >39)
LDL CALC: 140 mg/dL — AB (ref 0–99)
LDl/HDL Ratio: 3.4 ratio units (ref 0.0–3.6)
TRIGLYCERIDES: 171 mg/dL — AB (ref 0–149)
VLDL Cholesterol Cal: 34 mg/dL (ref 5–40)

## 2015-11-20 LAB — TSH: TSH: 1.66 u[IU]/mL (ref 0.450–4.500)

## 2015-11-20 LAB — PROLACTIN: PROLACTIN: 26.8 ng/mL — AB (ref 4.0–15.2)

## 2015-11-20 LAB — PSA: Prostate Specific Ag, Serum: 1.8 ng/mL (ref 0.0–4.0)

## 2015-11-20 LAB — TESTOSTERONE: Testosterone: 304 ng/dL — ABNORMAL LOW (ref 348–1197)

## 2015-11-24 ENCOUNTER — Telehealth: Payer: Self-pay

## 2015-11-24 DIAGNOSIS — E229 Hyperfunction of pituitary gland, unspecified: Principal | ICD-10-CM

## 2015-11-24 DIAGNOSIS — R7989 Other specified abnormal findings of blood chemistry: Secondary | ICD-10-CM

## 2015-11-24 NOTE — Telephone Encounter (Signed)
Elevated prolactin could be a pituitary adenoma which is a benign type of brain tumor. I think it will be a normal workup but I would do this because  overall he has good health.

## 2015-11-24 NOTE — Telephone Encounter (Signed)
-----   Message from Jerrol Banana., MD sent at 11/21/2015  2:17 PM EDT ----- Everything stable testosterone slightly low and prolactin level somewhat elevated. Refer back to endocrinology for follow-up of prolactin

## 2015-11-24 NOTE — Telephone Encounter (Signed)
Advised patient as below. Patient doesn't remember why he went to the endocrinologist. Can you explain why elevated prolactin level needs to have a work up? Also, which endocrinologist would you recommend? Please advise. Thanks!

## 2015-11-25 DIAGNOSIS — R7989 Other specified abnormal findings of blood chemistry: Secondary | ICD-10-CM | POA: Insufficient documentation

## 2015-11-25 DIAGNOSIS — E229 Hyperfunction of pituitary gland, unspecified: Principal | ICD-10-CM

## 2015-11-25 NOTE — Telephone Encounter (Signed)
Advised pt. Referred to [facility]. Renaldo Fiddler, CMA

## 2015-12-05 ENCOUNTER — Ambulatory Visit: Payer: 59

## 2015-12-08 ENCOUNTER — Encounter: Payer: Self-pay | Admitting: Urology

## 2015-12-08 ENCOUNTER — Ambulatory Visit (INDEPENDENT_AMBULATORY_CARE_PROVIDER_SITE_OTHER): Payer: 59 | Admitting: Urology

## 2015-12-08 VITALS — BP 113/70 | HR 58 | Ht 70.0 in | Wt 179.1 lb

## 2015-12-08 DIAGNOSIS — N481 Balanitis: Secondary | ICD-10-CM | POA: Diagnosis not present

## 2015-12-08 DIAGNOSIS — N528 Other male erectile dysfunction: Secondary | ICD-10-CM | POA: Diagnosis not present

## 2015-12-08 DIAGNOSIS — N529 Male erectile dysfunction, unspecified: Secondary | ICD-10-CM

## 2015-12-08 DIAGNOSIS — Z125 Encounter for screening for malignant neoplasm of prostate: Secondary | ICD-10-CM

## 2015-12-08 DIAGNOSIS — N4889 Other specified disorders of penis: Secondary | ICD-10-CM | POA: Insufficient documentation

## 2015-12-08 LAB — URINALYSIS, COMPLETE
Bilirubin, UA: NEGATIVE
GLUCOSE, UA: NEGATIVE
Ketones, UA: NEGATIVE
Leukocytes, UA: NEGATIVE
NITRITE UA: NEGATIVE
PH UA: 6 (ref 5.0–7.5)
Protein, UA: NEGATIVE
Specific Gravity, UA: 1.025 (ref 1.005–1.030)
Urobilinogen, Ur: 0.2 mg/dL (ref 0.2–1.0)

## 2015-12-08 LAB — MICROSCOPIC EXAMINATION
BACTERIA UA: NONE SEEN
EPITHELIAL CELLS (NON RENAL): NONE SEEN /HPF (ref 0–10)
WBC UA: NONE SEEN /HPF (ref 0–?)

## 2015-12-08 NOTE — Progress Notes (Signed)
12/08/2015 11:59 AM   Danny Ortiz 05-11-1956 [id number]  Referring provider: Jerrol Ortiz., MD [address] [place], [place] [address]  Chief Complaint  Patient presents with  . Balanitis    referred by Dr. Rosanna Ortiz    HPI:  1- Penile Irritation / Herpetic Neuropathy - pt with bothersome penile burning / itching sensation at area of frenulum x few mos. Had derm eval including biopsy that was negative for neoplasm. Non-diabetic. Circumcised. Non-smoker. He has h/o herpes with occasional outbreak especially with stress, now much improved on Valcyte supression. Exam completely unremarkable. No phimosis / clinical fungal balanitis / mass / scaly erythema.  Overall bother modest and now managing with OTC prns.  2 - Prostate Screening - pt's grandfather with lethal prostate cancer 2017 - PSA 1.8 / DRE 45gm smooth  3 - Erectile Dysfunction - on sildenafil 60-100mg  with satisfactio for difficulty maintaining erection. Libido preserved.  Today "Danny Ortiz" is seen in consulatation for above, specifically for furthe revaluation and manamgememt of penile irritation. He is referred by Dr. Rosanna Ortiz.    PMH: Past Medical History  Diagnosis Date  . Bronchitis   . Balanitis   . Heartburn   . Sleep apnea     Surgical History: Past Surgical History  Procedure Laterality Date  . Tonsillectomy and adenoidectomy      Home Medications:    Medication List       This list is accurate as of: 12/08/15 11:59 AM.  Always use your most recent med list.               acyclovir cream 5 %  Commonly known as:  ZOVIRAX  Apply 1 application topically every 3 (three) hours. PRN     fluticasone 50 MCG/ACT nasal spray  Commonly known as:  FLONASE  Place 2 sprays into the nose daily.     loratadine 10 MG tablet  Commonly known as:  CLARITIN  Take 1 tablet (10 mg total) by mouth daily.     omeprazole 20 MG capsule  Commonly known as:  PRILOSEC  Take 20 mg by mouth daily.      sildenafil 20 MG tablet  Commonly known as:  REVATIO  Take 20-100 mg by mouth daily as needed.     valACYclovir 500 MG tablet  Commonly known as:  VALTREX  Take 1 tablet (500 mg total) by mouth 3 (three) times daily.     zolpidem 10 MG tablet  Commonly known as:  AMBIEN  TAKE 1/2 TABLET BY MOUTH AT BEDTIME        Allergies:  Allergies  Allergen Reactions  . Penicillins Nausea And Vomiting  . Wellbutrin [Bupropion] Other (See Comments)    Jittery/shakes, dry mouth, felt bad    Family History: Family History  Problem Relation Age of Onset  . Heart disease Mother   . Colon polyps Maternal Grandmother   . Kidney disease Neg Hx   . Prostate cancer Maternal Grandfather     Social History:  reports that he has never smoked. He does not have any smokeless tobacco history on file. He reports that he drinks alcohol. He reports that he does not use illicit drugs.  ROS: UROLOGY Frequent Urination?: No Hard to postpone urination?: No Burning/pain with urination?: No Get up at night to urinate?: Yes Leakage of urine?: No Urine stream starts and stops?: No Trouble starting stream?: No Do you have to strain to urinate?: No Blood in urine?: No Urinary tract  infection?: No Sexually transmitted disease?: No Injury to kidneys or bladder?: No Painful intercourse?: No Weak stream?: No Erection problems?: No Penile pain?: No  Gastrointestinal Nausea?: No Vomiting?: No Indigestion/heartburn?: No Diarrhea?: No Constipation?: No  Constitutional Fever: No Night sweats?: No Weight loss?: No Fatigue?: No  Skin Skin rash/lesions?: No Itching?: No  Eyes Blurred vision?: No Double vision?: No  Ears/Nose/Throat Sore throat?: No Sinus problems?: No  Hematologic/Lymphatic Swollen glands?: No Easy bruising?: No  Cardiovascular Leg swelling?: No Chest pain?: No  Respiratory Cough?: No Shortness of breath?: No  Endocrine Excessive thirst?:  No  Musculoskeletal Back pain?: No Joint pain?: No  Neurological Headaches?: No Dizziness?: No  Psychologic Depression?: No Anxiety?: No  Physical Exam: BP 113/70 mmHg  Pulse 58  Ht 5\' 10"  (1.778 m)  Wt 179 lb 1.6 oz (81.239 kg)  BMI 25.70 kg/m2  Constitutional:  Alert and oriented, No acute distress. HEENT: [place] AT, moist mucus membranes.  Trachea midline, no masses. Cardiovascular: No clubbing, cyanosis, or edema. Respiratory: Normal respiratory effort, no increased work of breathing. GI: Abdomen is soft, nontender, nondistended, no abdominal masses GU: No CVA tenderness. DRE 45gm smooth. Phallus straight / cric'd. Area of sensatoin on frenulum w/o mass, erythema, scalyness, whatsoever.  Skin: No rashes, bruises or suspicious lesions. Lymph: No cervical or inguinal adenopathy. Neurologic: Grossly intact, no focal deficits, moving all 4 extremities. Psychiatric: Normal mood and affect.  Laboratory Data: Lab Results  Component Value Date   WBC 6.8 11/19/2015   HGB 16.3 10/23/2014   HCT 42.6 11/19/2015   MCV 93 11/19/2015   PLT 210 11/19/2015    Lab Results  Component Value Date   CREATININE 0.89 11/19/2015    Lab Results  Component Value Date   PSA 1.7 10/23/2014    Lab Results  Component Value Date   TESTOSTERONE 304* 11/19/2015    No results found for: HGBA1C  Urinalysis    Component Value Date/Time   APPEARANCEUR Clear 12/08/2015 1127   GLUCOSEU Negative 12/08/2015 1127   BILIRUBINUR Negative 12/08/2015 1127   BILIRUBINUR neg 11/19/2015 1048   PROTEINUR Negative 12/08/2015 1127   PROTEINUR neg 11/19/2015 1048   UROBILINOGEN 0.2 11/19/2015 1048   NITRITE Negative 12/08/2015 1127   NITRITE neg 11/19/2015 1048   LEUKOCYTESUR Negative 12/08/2015 1127   LEUKOCYTESUR Negative 11/19/2015 1048    Pertinent Imaging: none  Assessment & Plan:    1- Penile Irritation / Herpetic Neuropathy - exam reassuring. No worriseom mass / erythema that would  suggests neoplasm, BX also negative as well. No phimosis or fungal balanitis. Suspect stigmata of herpetic neuropathy. Agree with Valcyte supressoin. Discussed management options from very conservative (prn OTC moisturizer / itching or sun burn ointments for symptom management) to more aggressive with trial of lyrica or other neuropathy meds and he opts for conservative measures. I agree. Rec against topical steroids as may actually promote herpetic outbreak. He was mostly reassured. Strongly suggested repeat eval if any scaly / bleeding erythema or wart-like or mass-like areas develop.  2 - Prostate Screening - up to date this year. Rec continue yearly screening until age 59-75 given family history and overal excellent health.   3 - Erectile Dysfunction - doing very well on prn PDE5i, he is on one of safest / cost-effective meds.   4 - RTC Urol prn.   Return if symptoms worsen or fail to improve.  Alexis Frock, [facility] [address] [place], [place] [address] [phone]

## 2016-01-12 ENCOUNTER — Other Ambulatory Visit: Payer: Self-pay | Admitting: Family Medicine

## 2016-01-12 NOTE — Telephone Encounter (Signed)
Pt contacted office for refill request on the following medications: Sildenafil Citrate 20 mg to [facility]. Last written: 11/27/14 with no refills Last OV: 11/19/15 Please advise. Thanks TNP

## 2016-01-13 MED ORDER — SILDENAFIL CITRATE 20 MG PO TABS: 20.0000 mg | ORAL_TABLET | Freq: Every day | ORAL | Status: DC | PRN

## 2016-01-13 NOTE — Telephone Encounter (Signed)
Med sent into the pharmacy.  

## 2016-01-13 NOTE — Telephone Encounter (Signed)
Pt called wanting to check on RX.  To see if it was ok'ed.  Thanks, [employer]

## 2016-01-13 NOTE — Telephone Encounter (Signed)
Please review-aa 

## 2016-01-13 NOTE — Telephone Encounter (Signed)
Pt advised, RX sent in-aa 

## 2016-01-13 NOTE — Telephone Encounter (Signed)
Ok to rf--#[id number] thanks

## 2016-01-28 ENCOUNTER — Other Ambulatory Visit: Payer: Self-pay | Admitting: Family Medicine

## 2016-02-16 ENCOUNTER — Telehealth: Payer: Self-pay | Admitting: Family Medicine

## 2016-02-16 DIAGNOSIS — A6000 Herpesviral infection of urogenital system, unspecified: Secondary | ICD-10-CM

## 2016-02-16 NOTE — Telephone Encounter (Signed)
Please review-aa 

## 2016-02-16 NOTE — Telephone Encounter (Signed)
Pt called in needing refill on his valACYclovir (VALTREX) 500 MG tablet  He wants to increase the dosage to 1000mg .  He says he feels like it just not strong as he needs.  He said he has been doubling the dosage.  He uses [facility].  Pt's call back is [phone]  Thanks Con [employer]

## 2016-02-18 MED ORDER — VALACYCLOVIR HCL 500 MG PO TABS
500.0000 mg | ORAL_TABLET | Freq: Three times a day (TID) | ORAL | Status: DC
Start: 2016-02-18 — End: 2016-06-09

## 2016-02-18 NOTE — Telephone Encounter (Signed)
Pt called saying he is completely out and would like the refill to day if possible.  Thanks Con [employer]

## 2016-02-18 NOTE — Telephone Encounter (Signed)
Pt informed. Medication sent to pharmacy  

## 2016-02-18 NOTE — Telephone Encounter (Signed)
The suppressive doses one a day, not 3 a day. That is the treatment for intermittent outbreaks. He can have 90 tablets one time at 500 mg 3 times a day. No refills. I will need to visit with him to figure out what's going on. I also don't think he is having herpetic outbreaks. I think have discussed this with him before.

## 2016-02-18 NOTE — Telephone Encounter (Signed)
Is this for outbreaks?--if so higher dose not appropriate--higher dose would only be for shingles.

## 2016-02-18 NOTE — Telephone Encounter (Signed)
Pt reports that it does not work very well for him at that dose but would like a a 30 day supply because he has only been getting a 10 day supply since he takes it 3 times a day.

## 2016-02-18 NOTE — Telephone Encounter (Signed)
Please review-aa 

## 2016-03-26 ENCOUNTER — Telehealth: Payer: Self-pay | Admitting: Family Medicine

## 2016-03-26 NOTE — Telephone Encounter (Signed)
Pt stated that when he came in for his CPE in March 2017 he was advised that he needed a colonoscopy. Pt would like a referral for a preventive screening colonoscopy and he stated he didn't have a preference of [facility] or [facility]. Please advise. Thanks TNP

## 2016-03-26 NOTE — Telephone Encounter (Signed)
OK 

## 2016-04-13 ENCOUNTER — Telehealth: Payer: Self-pay | Admitting: Family Medicine

## 2016-04-13 DIAGNOSIS — Z1211 Encounter for screening for malignant neoplasm of colon: Secondary | ICD-10-CM

## 2016-04-13 NOTE — Telephone Encounter (Signed)
Dr. Alben Spittle last message. Ok to refer. Referral ordered. Thanks.

## 2016-04-13 NOTE — Telephone Encounter (Signed)
Pt is requesting referral for a colonoscopy.He states this was talked about when he was in office for CPE

## 2016-04-21 ENCOUNTER — Telehealth: Payer: Self-pay

## 2016-04-21 ENCOUNTER — Other Ambulatory Visit: Payer: Self-pay

## 2016-04-21 NOTE — Telephone Encounter (Signed)
Screening Colonoscopy Z12.11 [facility] 06/08/2016 Please pre cert  

## 2016-04-21 NOTE — Telephone Encounter (Signed)
Gastroenterology Pre-Procedure Review  Request Date: 06/08/2016 Requesting Physician: Dr. Rosanna Randy  PATIENT REVIEW QUESTIONS: The patient responded to the following health history questions as indicated:    1. Are you having any GI issues? no 2. Do you have a personal history of Polyps? no 3. Do you have a family history of Colon Cancer or Polyps? no 4. Diabetes Mellitus? no 5. Joint replacements in the past 12 months?no 6. Major health problems in the past 3 months?no 7. Any artificial heart valves, MVP, or defibrillator?no    MEDICATIONS & ALLERGIES:    Patient reports the following regarding taking any anticoagulation/antiplatelet therapy:   Plavix, Coumadin, Eliquis, Xarelto, Lovenox, Pradaxa, Brilinta, or Effient? no Aspirin? no  Patient confirms/reports the following medications:  Current Outpatient Prescriptions  Medication Sig Dispense Refill  . omeprazole (PRILOSEC) 20 MG capsule Take 20 mg by mouth daily.      . sildenafil (REVATIO) 20 MG tablet Take 1-5 tablets (20-100 mg total) by mouth daily as needed. 50 tablet 12  . valACYclovir (VALTREX) 500 MG tablet Take 1 tablet (500 mg total) by mouth 3 (three) times daily. 90 tablet 0  . zolpidem (AMBIEN) 10 MG tablet TAKE 1/2 TABLET BY MOUTH AT BEDTIME 30 tablet 5  . fluticasone (FLONASE) 50 MCG/ACT nasal spray Place 2 sprays into the nose daily.       No current facility-administered medications for this visit.     Patient confirms/reports the following allergies:  Allergies  Allergen Reactions  . Penicillins Nausea And Vomiting  . Wellbutrin [Bupropion] Other (See Comments)    Jittery/shakes, dry mouth, felt bad    No orders of the defined types were placed in this encounter.   AUTHORIZATION INFORMATION Primary Insurance: 1D#: Group #:  Secondary Insurance: 1D#: Group #:  SCHEDULE INFORMATION: Date: 06/08/2016 Time: Location: [facility]

## 2016-06-07 DIAGNOSIS — J069 Acute upper respiratory infection, unspecified: Secondary | ICD-10-CM | POA: Diagnosis not present

## 2016-06-07 DIAGNOSIS — R0600 Dyspnea, unspecified: Secondary | ICD-10-CM | POA: Diagnosis not present

## 2016-06-07 DIAGNOSIS — R05 Cough: Secondary | ICD-10-CM | POA: Diagnosis not present

## 2016-06-07 DIAGNOSIS — R0789 Other chest pain: Secondary | ICD-10-CM | POA: Diagnosis not present

## 2016-06-08 ENCOUNTER — Ambulatory Visit: Admission: RE | Admit: 2016-06-08 | Payer: Self-pay | Source: Ambulatory Visit | Admitting: Gastroenterology

## 2016-06-08 ENCOUNTER — Encounter: Admission: RE | Payer: Self-pay | Source: Ambulatory Visit

## 2016-06-08 SURGERY — COLONOSCOPY WITH PROPOFOL
Anesthesia: General

## 2016-06-09 ENCOUNTER — Other Ambulatory Visit: Payer: Self-pay | Admitting: Family Medicine

## 2016-06-09 DIAGNOSIS — R0789 Other chest pain: Secondary | ICD-10-CM | POA: Diagnosis not present

## 2016-06-09 DIAGNOSIS — H6123 Impacted cerumen, bilateral: Secondary | ICD-10-CM | POA: Diagnosis not present

## 2016-06-09 DIAGNOSIS — A6000 Herpesviral infection of urogenital system, unspecified: Secondary | ICD-10-CM

## 2016-06-09 NOTE — Telephone Encounter (Signed)
Pt contacted office for refill request on the following medications: valACYclovir (VALTREX) 500 MG tablet to [facility] Abbott Pao stated that he would like to have it sent for a quantity of 90 tablets but include refills if possible. Pt stated that he is leaving to go out of town Friday morning so he would really like to have it sent in by tomorrow 06/10/16 if possible. Last written: 02/18/16 90 tablets no refills Last OV: 11/19/15 Please advise. Thanks TNP

## 2016-06-21 DIAGNOSIS — R0789 Other chest pain: Secondary | ICD-10-CM | POA: Diagnosis not present

## 2016-06-21 DIAGNOSIS — R0600 Dyspnea, unspecified: Secondary | ICD-10-CM | POA: Diagnosis not present

## 2016-06-28 DIAGNOSIS — R05 Cough: Secondary | ICD-10-CM | POA: Diagnosis not present

## 2016-06-28 DIAGNOSIS — J40 Bronchitis, not specified as acute or chronic: Secondary | ICD-10-CM | POA: Diagnosis not present

## 2016-07-06 DIAGNOSIS — R918 Other nonspecific abnormal finding of lung field: Secondary | ICD-10-CM | POA: Diagnosis not present

## 2016-07-06 DIAGNOSIS — R05 Cough: Secondary | ICD-10-CM | POA: Diagnosis not present

## 2016-07-06 DIAGNOSIS — R0602 Shortness of breath: Secondary | ICD-10-CM | POA: Diagnosis not present

## 2016-07-26 DIAGNOSIS — H6501 Acute serous otitis media, right ear: Secondary | ICD-10-CM | POA: Diagnosis not present

## 2016-07-26 DIAGNOSIS — H6981 Other specified disorders of Eustachian tube, right ear: Secondary | ICD-10-CM | POA: Diagnosis not present

## 2016-07-26 DIAGNOSIS — H902 Conductive hearing loss, unspecified: Secondary | ICD-10-CM | POA: Diagnosis not present

## 2016-07-26 DIAGNOSIS — H6123 Impacted cerumen, bilateral: Secondary | ICD-10-CM | POA: Diagnosis not present

## 2016-09-26 ENCOUNTER — Other Ambulatory Visit: Payer: Self-pay | Admitting: Family Medicine

## 2016-10-17 DIAGNOSIS — R0981 Nasal congestion: Secondary | ICD-10-CM | POA: Diagnosis not present

## 2016-10-17 DIAGNOSIS — B349 Viral infection, unspecified: Secondary | ICD-10-CM | POA: Diagnosis not present

## 2016-11-03 DIAGNOSIS — T1512XA Foreign body in conjunctival sac, left eye, initial encounter: Secondary | ICD-10-CM | POA: Diagnosis not present

## 2016-11-08 ENCOUNTER — Telehealth: Payer: Self-pay | Admitting: Family Medicine

## 2016-11-08 NOTE — Telephone Encounter (Signed)
Pt is requesting a call back.  Pt states his right testicle feels tighter that the left.  CB#[phone]/MW

## 2016-11-09 NOTE — Telephone Encounter (Signed)
Left message to call back  

## 2016-11-11 NOTE — Telephone Encounter (Signed)
Spoke with patient and he states he just noticed that one testicle looks bigger and harder than the other, off and on. He wanted Dr Rosanna Randy to look at it. I offered appointment today but patient wants to wait till his next appointment for CPE on 11/24/16. Patient advised that if this gets worse or he changes his mind we can see him sooner then that. Patient understood.-aa

## 2016-11-22 ENCOUNTER — Encounter: Payer: Self-pay | Admitting: Family Medicine

## 2016-11-24 ENCOUNTER — Encounter: Payer: Self-pay | Admitting: Family Medicine

## 2016-12-06 ENCOUNTER — Other Ambulatory Visit: Payer: Self-pay | Admitting: Family Medicine

## 2016-12-06 DIAGNOSIS — A6000 Herpesviral infection of urogenital system, unspecified: Secondary | ICD-10-CM

## 2016-12-06 NOTE — Telephone Encounter (Signed)
Please review-aa 

## 2016-12-06 NOTE — Telephone Encounter (Signed)
[facility]ed [facility] request for the following medication for patient.  90-day prescriptions can be filled for 30 day supply upon patient request. Thanks CC  valACYclovir (VALTREX) 500 MG tablet  Take 1 tablet (500 MG Total) by mouth 3 (three) times daily.

## 2016-12-08 MED ORDER — VALACYCLOVIR HCL 500 MG PO TABS: 500.0000 mg | ORAL_TABLET | Freq: Three times a day (TID) | ORAL | 3 refills | Status: DC

## 2016-12-08 NOTE — Telephone Encounter (Signed)
ok 

## 2016-12-15 ENCOUNTER — Other Ambulatory Visit: Payer: Self-pay | Admitting: Family Medicine

## 2016-12-15 DIAGNOSIS — A6000 Herpesviral infection of urogenital system, unspecified: Secondary | ICD-10-CM

## 2016-12-15 MED ORDER — VALACYCLOVIR HCL 500 MG PO TABS: 500.0000 mg | ORAL_TABLET | Freq: Three times a day (TID) | ORAL | 3 refills | Status: DC

## 2016-12-15 NOTE — Telephone Encounter (Signed)
DONE-AA

## 2016-12-15 NOTE — Telephone Encounter (Signed)
[facility] for a 90-days supply for the following medication.  Thanks CC  valACYclovir (VALTREX) 500 MG tablet  Take 1 tablet ( 500 MG total) by mouth 3 times daily.

## 2016-12-21 ENCOUNTER — Encounter: Payer: Self-pay | Admitting: Family Medicine

## 2016-12-30 ENCOUNTER — Ambulatory Visit (INDEPENDENT_AMBULATORY_CARE_PROVIDER_SITE_OTHER): Payer: BLUE CROSS/BLUE SHIELD | Admitting: Family Medicine

## 2016-12-30 ENCOUNTER — Encounter: Payer: Self-pay | Admitting: Family Medicine

## 2016-12-30 VITALS — BP 104/66 | HR 66 | Temp 97.6°F | Resp 14 | Ht 69.5 in | Wt 185.0 lb

## 2016-12-30 DIAGNOSIS — E229 Hyperfunction of pituitary gland, unspecified: Secondary | ICD-10-CM

## 2016-12-30 DIAGNOSIS — K219 Gastro-esophageal reflux disease without esophagitis: Secondary | ICD-10-CM | POA: Diagnosis not present

## 2016-12-30 DIAGNOSIS — E299 Testicular dysfunction, unspecified: Secondary | ICD-10-CM | POA: Diagnosis not present

## 2016-12-30 DIAGNOSIS — G4709 Other insomnia: Secondary | ICD-10-CM

## 2016-12-30 DIAGNOSIS — J301 Allergic rhinitis due to pollen: Secondary | ICD-10-CM

## 2016-12-30 DIAGNOSIS — Z1211 Encounter for screening for malignant neoplasm of colon: Secondary | ICD-10-CM

## 2016-12-30 DIAGNOSIS — Z125 Encounter for screening for malignant neoplasm of prostate: Secondary | ICD-10-CM

## 2016-12-30 DIAGNOSIS — R7989 Other specified abnormal findings of blood chemistry: Secondary | ICD-10-CM

## 2016-12-30 DIAGNOSIS — Z0000 Encounter for general adult medical examination without abnormal findings: Secondary | ICD-10-CM | POA: Diagnosis not present

## 2016-12-30 LAB — POCT URINALYSIS DIPSTICK
BILIRUBIN UA: NEGATIVE
Blood, UA: NEGATIVE
GLUCOSE UA: NEGATIVE
Ketones, UA: NEGATIVE
LEUKOCYTES UA: NEGATIVE
NITRITE UA: NEGATIVE
Protein, UA: NEGATIVE
Spec Grav, UA: 1.025 (ref 1.010–1.025)
Urobilinogen, UA: 0.2 E.U./dL
pH, UA: 6 (ref 5.0–8.0)

## 2016-12-30 MED ORDER — LORATADINE 10 MG PO TABS: 10.0000 mg | ORAL_TABLET | Freq: Every day | ORAL | 3 refills | Status: DC

## 2016-12-30 NOTE — Progress Notes (Signed)
Patient: Danny Ortiz, Male    DOB: 1955/12/27, 61 y.o.   MRN: [id number] Visit Date: 12/30/2016  Today's Provider: Wilhemena Durie, MD   Chief Complaint  Patient presents with  . Annual Exam   Subjective:  Danny Ortiz is a 61 y.o. male who presents today for health maintenance and complete physical. He feels fairly well-allergy symptoms present and patient is not taking anything for this at this time, he use to be on Fluticasone but it was not effective for the patient. He reports exercising not right now, some walking not much. He reports he is sleeping fairly well, has not had to take Ambien in a while. Immunization History  Administered Date(s) Administered  . Td 09/11/2004  . Tdap 10/10/2014   Last colonoscopy 10/19/04 normal, repeat in 2016.  Review of Systems  Constitutional: Negative.   HENT: Positive for congestion, postnasal drip and rhinorrhea.   Eyes: Negative.   Respiratory: Positive for cough.   Cardiovascular: Negative.   Gastrointestinal: Negative.   Endocrine: Negative.   Genitourinary: Negative.   Musculoskeletal: Negative.   Skin: Negative.   Allergic/Immunologic: Negative.   Neurological: Positive for numbness (hands in the morning sometimes).  Hematological: Negative.   Psychiatric/Behavioral: Negative.     Social History   Social History  . Marital status: Married    Spouse name: N/A  . Number of children: N/A  . Years of education: N/A   Occupational History  . Not on file.   Social History Main Topics  . Smoking status: Never Smoker  . Smokeless tobacco: Never Used  . Alcohol use 0.0 oz/week     Comment: OCCASIONALLY  . Drug use: No  . Sexual activity: No   Other Topics Concern  . Not on file   Social History Narrative  . No narrative on file    Patient Active Problem List   Diagnosis Date Noted  . Penile irritation 12/08/2015  . Prostate cancer screening 12/08/2015  . Erectile dysfunction 12/08/2015  . Elevated prolactin  level ([place]) 11/25/2015  . Seasonal allergies 02/14/2015  . Clinical depression 02/12/2015  . Testicular dysfunction 02/12/2015  . ED (erectile dysfunction) of organic origin 02/12/2015  . Acid reflux 02/12/2015  . Genital herpes 02/12/2015  . 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 High risk sexual behavior 02/12/2015  . HLD (hyperlipidemia) 02/12/2015  . 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 Cannot sleep 02/12/2015  . Auditory vertigo 02/12/2015  . Mild major depression ([place]) 02/12/2015  . Obstructive apnea 02/12/2015  . Snores 02/12/2015  . Episode of syncope 02/12/2015  . Trigeminal neuralgia 02/12/2015  . Avitaminosis D 02/12/2015    Past Surgical History:  Procedure Laterality Date  . TONSILLECTOMY AND ADENOIDECTOMY      His family history includes Colon polyps in his maternal grandmother; Heart disease in his mother; Lymphoma in his mother; Prostate cancer in his maternal grandfather.     Outpatient Encounter Prescriptions as of 12/30/2016  Medication Sig Note  . Ascorbic Acid (VITAMIN C) 100 MG tablet Take 100 mg by mouth daily.   Marland Kitchen omeprazole (PRILOSEC) 20 MG capsule Take 20 mg by mouth daily. prn   . sildenafil (REVATIO) 20 MG tablet Take 1-5 tablets (20-100 mg total) by mouth daily as needed.   . valACYclovir (VALTREX) 500 MG tablet Take 1 tablet (500 mg total) by mouth 3 (three) times daily. (Patient taking differently: Take 500 mg by mouth 3 (three) times daily. prn)   . zolpidem (AMBIEN) 10 MG tablet TAKE 1 TABLET  BY MOUTH AT BEDTIME (Patient not taking: Reported on 12/30/2016)   . [DISCONTINUED] fluticasone (FLONASE) 50 MCG/ACT nasal spray Place 2 sprays into the nose daily.   04/21/2016: PRN   No facility-administered encounter medications on file as of 12/30/2016.     Patient Care Team: Jerrol Banana., MD as PCP - General (Family Medicine)      Objective:   Vitals:  Vitals:   12/30/16 0921  BP: 104/66  Pulse: 66  Resp: 14  Temp: 97.6 F (36.4 C)  Weight: 185 lb  (83.9 kg)  Height: 5' 9.5" (1.765 m)    Physical Exam  Constitutional: He is oriented to person, place, and time. He appears well-developed and well-nourished.  HENT:  Head: Normocephalic and atraumatic.  Right Ear: External ear normal.  Left Ear: External ear normal.  Nose: Nose normal.  Mouth/Throat: Oropharynx is clear and moist.  Eyes: Conjunctivae are normal. Pupils are equal, round, and reactive to light.  Neck: Normal range of motion. Neck supple.  Cardiovascular: Normal rate, regular rhythm, normal heart sounds and intact distal pulses.  Exam reveals no gallop.   No murmur heard. Pulmonary/Chest: Effort normal and breath sounds normal. No respiratory distress. He has no wheezes.  Abdominal: Soft. He exhibits no distension. There is no tenderness.  Genitourinary: Rectum normal, prostate normal and penis normal. No penile tenderness.  Musculoskeletal: He exhibits no edema or tenderness.  Neurological: He is alert and oriented to person, place, and time. No cranial nerve deficit. Coordination normal.  Skin: Skin is warm and dry. No rash noted. No erythema.  Psychiatric: He has a normal mood and affect. His behavior is normal. Judgment and thought content normal.  Discussed health benefits of physical activity, and encouraged him to engage in regular exercise appropriate for his age and condition.     Depression Screen PHQ 2/9 Scores 12/30/2016 05/06/2015  PHQ - 2 Score 0 0  PHQ- 9 Score 1 -    Assessment & Plan:    1. Annual physical exam - Comprehensive metabolic panel - Lipid Panel With LDL/HDL Ratio - CBC with Differential/Platelet - TSH - POCT urinalysis dipstick  2. Colon cancer screening Refer for colonoscopy - Ambulatory referral to Gastroenterology  3. Other insomnia Stable. Not taking Lorrin Mais currently.  4. Testicular dysfunction Re check levels, were abnormal last year - Testosterone - Prolactin  5. Gastroesophageal reflux disease, esophagitis presence  not specified Stable on Omeprazole use as needed  6. Prostate cancer screening - PSA  7. Seasonal allergic rhinitis due to pollen Try Loratadine. Flonase was not effective for patient.  8. Elevated prolactin level ([facility]) - Testosterone - Prolactin 9. OSA Patient had CPAP machine but could not tolerate it. Had sleep study in the past. Epworth score is a 6 today. Advised patient to work on habits.  HPI, Exam and A&P transcribed by Theressa Millard, RMA under direction and in the presence of Miguel Aschoff, MD.

## 2016-12-31 ENCOUNTER — Telehealth: Payer: Self-pay

## 2016-12-31 LAB — COMPREHENSIVE METABOLIC PANEL
A/G RATIO: 2.2 (ref 1.2–2.2)
ALK PHOS: 66 IU/L (ref 39–117)
ALT: 16 IU/L (ref 0–44)
AST: 14 IU/L (ref 0–40)
Albumin: 4.7 g/dL (ref 3.6–4.8)
BILIRUBIN TOTAL: 0.6 mg/dL (ref 0.0–1.2)
BUN / CREAT RATIO: 20 (ref 10–24)
BUN: 18 mg/dL (ref 8–27)
CHLORIDE: 102 mmol/L (ref 96–106)
CO2: 27 mmol/L (ref 18–29)
Calcium: 9.7 mg/dL (ref 8.6–10.2)
Creatinine, Ser: 0.91 mg/dL (ref 0.76–1.27)
GFR calc Af Amer: 104 mL/min/{1.73_m2} (ref >59)
GFR calc non Af Amer: 90 mL/min/{1.73_m2} (ref >59)
GLUCOSE: 90 mg/dL (ref 65–99)
Globulin, Total: 2.1 g/dL (ref 1.5–4.5)
POTASSIUM: 5 mmol/L (ref 3.5–5.2)
Sodium: 143 mmol/L (ref 134–144)
Total Protein: 6.8 g/dL (ref 6.0–8.5)

## 2016-12-31 LAB — CBC WITH DIFFERENTIAL/PLATELET
Basophils Absolute: 0.1 10*3/uL (ref 0.0–0.2)
Basos: 1 %
EOS (ABSOLUTE): 0.6 10*3/uL — ABNORMAL HIGH (ref 0.0–0.4)
EOS: 8 %
HEMATOCRIT: 44.6 % (ref 37.5–51.0)
HEMOGLOBIN: 15.7 g/dL (ref 13.0–17.7)
Immature Grans (Abs): 0 10*3/uL (ref 0.0–0.1)
Immature Granulocytes: 0 %
LYMPHS ABS: 2 10*3/uL (ref 0.7–3.1)
Lymphs: 25 %
MCH: 31.8 pg (ref 26.6–33.0)
MCHC: 35.2 g/dL (ref 31.5–35.7)
MCV: 91 fL (ref 79–97)
MONOCYTES: 8 %
Monocytes Absolute: 0.7 10*3/uL (ref 0.1–0.9)
NEUTROS ABS: 4.6 10*3/uL (ref 1.4–7.0)
Neutrophils: 58 %
Platelets: 217 10*3/uL (ref 150–379)
RBC: 4.93 x10E6/uL (ref 4.14–5.80)
RDW: 14 % (ref 12.3–15.4)
WBC: 8 10*3/uL (ref 3.4–10.8)

## 2016-12-31 LAB — LIPID PANEL WITH LDL/HDL RATIO
CHOLESTEROL TOTAL: 218 mg/dL — AB (ref 100–199)
HDL: 40 mg/dL (ref >39)
LDL Calculated: 143 mg/dL — ABNORMAL HIGH (ref 0–99)
LDl/HDL Ratio: 3.6 ratio (ref 0.0–3.6)
Triglycerides: 175 mg/dL — ABNORMAL HIGH (ref 0–149)
VLDL CHOLESTEROL CAL: 35 mg/dL (ref 5–40)

## 2016-12-31 LAB — PSA: Prostate Specific Ag, Serum: 1.8 ng/mL (ref 0.0–4.0)

## 2016-12-31 LAB — TESTOSTERONE: Testosterone: 290 ng/dL (ref 264–916)

## 2016-12-31 LAB — TSH: TSH: 2.04 u[IU]/mL (ref 0.450–4.500)

## 2016-12-31 LAB — PROLACTIN: Prolactin: 27.7 ng/mL — ABNORMAL HIGH (ref 4.0–15.2)

## 2016-12-31 NOTE — Telephone Encounter (Signed)
Patient advised. Patient wanted to see if we could order [place] to check for carotid blockage as screening. Patient states he had syncope episode 2 years ago and they did one but not sure of results. No heart disease or blockage issues in his family that he knows of. I advised patient I am not sure if this would be covered for screening since he does not have symptoms of concern or family history to be able to code it. Please review,-aa

## 2017-01-01 NOTE — Telephone Encounter (Signed)
Normal carotids in 2014. I can order Carotid dopplers but it is not a screening test so insurance will not cover it.

## 2017-01-04 DIAGNOSIS — R1310 Dysphagia, unspecified: Secondary | ICD-10-CM | POA: Diagnosis not present

## 2017-01-04 DIAGNOSIS — Z1211 Encounter for screening for malignant neoplasm of colon: Secondary | ICD-10-CM | POA: Diagnosis not present

## 2017-01-04 NOTE — Telephone Encounter (Signed)
Pt advised, will wait at this time-aa

## 2017-01-06 DIAGNOSIS — N529 Male erectile dysfunction, unspecified: Secondary | ICD-10-CM | POA: Diagnosis not present

## 2017-01-06 DIAGNOSIS — E221 Hyperprolactinemia: Secondary | ICD-10-CM | POA: Diagnosis not present

## 2017-01-06 DIAGNOSIS — R7989 Other specified abnormal findings of blood chemistry: Secondary | ICD-10-CM | POA: Diagnosis not present

## 2017-01-12 ENCOUNTER — Other Ambulatory Visit: Payer: Self-pay | Admitting: Family Medicine

## 2017-01-13 NOTE — Telephone Encounter (Signed)
Pharmacy requesting refills.

## 2017-02-15 DIAGNOSIS — D123 Benign neoplasm of transverse colon: Secondary | ICD-10-CM | POA: Diagnosis not present

## 2017-02-15 DIAGNOSIS — K648 Other hemorrhoids: Secondary | ICD-10-CM | POA: Diagnosis not present

## 2017-02-15 DIAGNOSIS — K222 Esophageal obstruction: Secondary | ICD-10-CM | POA: Diagnosis not present

## 2017-02-15 DIAGNOSIS — K635 Polyp of colon: Secondary | ICD-10-CM | POA: Diagnosis not present

## 2017-02-15 DIAGNOSIS — Z1211 Encounter for screening for malignant neoplasm of colon: Secondary | ICD-10-CM | POA: Diagnosis not present

## 2017-02-15 DIAGNOSIS — D128 Benign neoplasm of rectum: Secondary | ICD-10-CM | POA: Diagnosis not present

## 2017-02-15 DIAGNOSIS — D126 Benign neoplasm of colon, unspecified: Secondary | ICD-10-CM | POA: Diagnosis not present

## 2017-02-15 DIAGNOSIS — K640 First degree hemorrhoids: Secondary | ICD-10-CM | POA: Diagnosis not present

## 2017-02-15 DIAGNOSIS — K449 Diaphragmatic hernia without obstruction or gangrene: Secondary | ICD-10-CM | POA: Diagnosis not present

## 2017-02-15 DIAGNOSIS — R1310 Dysphagia, unspecified: Secondary | ICD-10-CM | POA: Diagnosis not present

## 2017-02-15 LAB — HM COLONOSCOPY

## 2017-02-22 ENCOUNTER — Encounter: Payer: Self-pay | Admitting: Family Medicine

## 2017-03-08 ENCOUNTER — Encounter: Payer: Self-pay | Admitting: Family Medicine

## 2017-06-14 DIAGNOSIS — Z23 Encounter for immunization: Secondary | ICD-10-CM | POA: Diagnosis not present

## 2017-08-15 DIAGNOSIS — H16251 Phlyctenular keratoconjunctivitis, right eye: Secondary | ICD-10-CM | POA: Diagnosis not present

## 2017-10-26 DIAGNOSIS — H93299 Other abnormal auditory perceptions, unspecified ear: Secondary | ICD-10-CM | POA: Diagnosis not present

## 2017-10-26 DIAGNOSIS — H6123 Impacted cerumen, bilateral: Secondary | ICD-10-CM | POA: Diagnosis not present

## 2017-11-03 DIAGNOSIS — R0981 Nasal congestion: Secondary | ICD-10-CM | POA: Diagnosis not present

## 2017-11-03 DIAGNOSIS — R05 Cough: Secondary | ICD-10-CM | POA: Diagnosis not present

## 2017-11-17 DIAGNOSIS — J0100 Acute maxillary sinusitis, unspecified: Secondary | ICD-10-CM | POA: Diagnosis not present

## 2017-11-25 ENCOUNTER — Encounter: Payer: Self-pay | Admitting: Family Medicine

## 2017-11-25 ENCOUNTER — Ambulatory Visit
Admission: RE | Admit: 2017-11-25 | Discharge: 2017-11-25 | Disposition: A | Discharge status: Home / Self Care | Payer: BLUE CROSS/BLUE SHIELD | Source: Ambulatory Visit | Attending: Family Medicine | Admitting: Family Medicine

## 2017-11-25 ENCOUNTER — Ambulatory Visit: Payer: BLUE CROSS/BLUE SHIELD | Admitting: Family Medicine

## 2017-11-25 ENCOUNTER — Telehealth: Payer: Self-pay

## 2017-11-25 VITALS — BP 118/64 | HR 68 | Temp 97.9°F | Resp 16 | Wt 191.0 lb

## 2017-11-25 DIAGNOSIS — R918 Other nonspecific abnormal finding of lung field: Secondary | ICD-10-CM | POA: Insufficient documentation

## 2017-11-25 DIAGNOSIS — R059 Cough, unspecified: Secondary | ICD-10-CM

## 2017-11-25 DIAGNOSIS — J301 Allergic rhinitis due to pollen: Secondary | ICD-10-CM

## 2017-11-25 DIAGNOSIS — R05 Cough: Secondary | ICD-10-CM

## 2017-11-25 MED ORDER — MONTELUKAST SODIUM 10 MG PO TABS: 10.0000 mg | ORAL_TABLET | Freq: Every day | ORAL | 3 refills | Status: DC

## 2017-11-25 MED ORDER — FEXOFENADINE HCL 180 MG PO TABS: 180.0000 mg | ORAL_TABLET | Freq: Every day | ORAL | 0 refills | Status: DC

## 2017-11-25 MED ORDER — CEFDINIR 300 MG PO CAPS: 300.0000 mg | ORAL_CAPSULE | Freq: Two times a day (BID) | ORAL | 0 refills | Status: AC

## 2017-11-25 NOTE — Telephone Encounter (Signed)
-----   Message from Birdie Sons, MD sent at 11/25/2017 10:39 AM EDT ----- Chest Xr shows small area  Of pneumonia in right upper lung. Need to start cefdinir 300mg  two tablets daily for 14 days.  For allergies should also start prescription montelukast 10mg  once a day, #30 rf x 0, and OTC Allegra (or generic fexofenadine) 180mg  once a day Need follow up o.v. And chest xray in 2 weeks.

## 2017-11-25 NOTE — Patient Instructions (Addendum)
   Start using OTC Nasal Saline or nasal irrigation every 3-4 hours   Go to the [facility] on [address] for chest Xray

## 2017-11-25 NOTE — Telephone Encounter (Signed)
Advised patient as below. Medications were sent into the pharmacy. Patient will call back and schedule follow up in 2 weeks.

## 2017-11-25 NOTE — Progress Notes (Signed)
Patient: Danny Ortiz Male    DOB: October 11, 1955   62 y.o.   MRN: [id number] Visit Date: 11/25/2017  Today's Provider: Lelon Huh, MD   Chief Complaint  Patient presents with  . Cough   Subjective:    Cough  This is a new problem. The current episode started more than 1 month ago. The problem has been unchanged. The cough is productive of sputum. Associated symptoms include headaches, nasal congestion, postnasal drip and a sore throat. Pertinent negatives include no fever or wheezing.   Patient reports that he was seen at the walk in clinic twice for his symptoms. On 3/7 he was prescribed Doxycycline, Atrovent nasal spray, and tessalon Perles. He reports that his symptoms became worse, and was seen again on 3/21. He was treated with Levaquin and Astelin nasal spray.   Nasal drainage is clear. Taken OTC antihistamines and decongestants which helps briefly.    Allergies  Allergen Reactions  . Azithromycin Other (See Comments)    Increased HR  . Penicillins Nausea And Vomiting  . Prednisone Other (See Comments)    Increased hr  . Wellbutrin [Bupropion] Other (See Comments)    Jittery/shakes, dry mouth, felt bad     Current Outpatient Medications:  .  Ascorbic Acid (VITAMIN C) 100 MG tablet, Take 100 mg by mouth daily., Disp: , Rfl:  .  azelastine (ASTELIN) 0.1 % nasal spray, Place into the nose., Disp: , Rfl:  .  guaiFENesin-codeine 100-10 MG/5ML syrup, Take by mouth., Disp: , Rfl:  .  levofloxacin (LEVAQUIN) 500 MG tablet, Take 500 mg by mouth daily., Disp: , Rfl:  .  loratadine (CLARITIN) 10 MG tablet, Take 1 tablet (10 mg total) by mouth daily., Disp: 90 tablet, Rfl: 3 .  omeprazole (PRILOSEC) 20 MG capsule, Take 20 mg by mouth daily. prn, Disp: , Rfl:  .  sildenafil (REVATIO) 20 MG tablet, TAKE 1-5 TABLETS BY MOUTH DAILY AS NEEDED, Disp: 50 tablet, Rfl: 10 .  valACYclovir (VALTREX) 500 MG tablet, Take 1 tablet (500 mg total) by mouth 3 (three) times daily., Disp:  270 tablet, Rfl: 3 .  zolpidem (AMBIEN) 10 MG tablet, TAKE 1 TABLET BY MOUTH AT BEDTIME (Patient not taking: Reported on 12/30/2016), Disp: 30 tablet, Rfl: 5  Review of Systems  Constitutional: Negative for fever.  HENT: Positive for postnasal drip and sore throat.   Respiratory: Positive for cough. Negative for wheezing.   Neurological: Positive for headaches.    Social History   Tobacco Use  . Smoking status: Never Smoker  . Smokeless tobacco: Never Used  Substance Use Topics  . Alcohol use: Yes    Alcohol/week: 0.0 oz    Comment: OCCASIONALLY   Objective:   BP 118/64 (BP Location: Right Arm, Patient Position: Sitting, Cuff Size: Normal)   Pulse 68   Temp 97.9 F (36.6 C)   Resp 16   Wt 191 lb (86.6 kg)   SpO2 97%   BMI 27.80 kg/m  Vitals:   11/25/17 0846  BP: 118/64  Pulse: 68  Resp: 16  Temp: 97.9 F (36.6 C)  SpO2: 97%  Weight: 191 lb (86.6 kg)     Physical Exam  General Appearance:    Alert, cooperative, no distress  HENT:   bilateral TM normal without fluid or infection, neck without nodes, throat normal without erythema or exudate, frontal sinus tender and nasal mucosa pale and congested  Eyes:    PERRL, conjunctiva/corneas clear, EOM's intact  Lungs:     Clear to auscultation bilaterally, respirations unlabored  Heart:    Regular rate and rhythm  Neurologic:   Awake, alert, oriented x 3. No apparent focal neurological           defect.       Dg Chest 2 View  Result Date: 11/25/2017 CLINICAL DATA:  Persistent cough. Patient has been sick for past month.  EXAM: CHEST - 2 VIEW COMPARISON:  07/21/2014.  FINDINGS: Mediastinum hilar structures are normal. Small density right upper lung most likely a small focus of atelectasis/infiltrate. Follow-up exams demonstrate clearing suggested. No pleural effusion or pneumothorax. Heart size normal. No acute bony abnormality.  IMPRESSION: Small density right upper lung most likely a small area of  atelectasis/infiltrate. Followup PA and lateral chest X-ray is recommended in 3-4 weeks following trial of antibiotic therapy to ensure resolution and exclude underlying malignancy. Electronically Signed   By: Marcello Moores  Register   On: 11/25/2017 09:48       Assessment & Plan:     1. Cough ? Pneumonia on chest XR. Has completed doxycycline and levofloxacin. Will treat with 14 days cefdinir and repeat chest XR.  - DG Chest 2 View; Future  2. Allergic rhinitis due to pollen, unspecified seasonality Add montelukast and continue OTC fexofenadine.         Lelon Huh, MD  [facility]

## 2017-11-29 ENCOUNTER — Telehealth: Payer: Self-pay

## 2017-11-29 MED ORDER — HYDROCODONE-HOMATROPINE 5-1.5 MG/5ML PO SYRP: 5.0000 mL | ORAL_SOLUTION | Freq: Three times a day (TID) | ORAL | 0 refills | Status: DC | PRN

## 2017-11-29 NOTE — Telephone Encounter (Signed)
Please advise 

## 2017-11-29 NOTE — Telephone Encounter (Signed)
Pt was seen 11/25/2017 for pneumonia. He is still c/o cough, and would like a medication for this sent to [place].

## 2017-12-21 ENCOUNTER — Telehealth: Payer: Self-pay | Admitting: Family Medicine

## 2017-12-21 DIAGNOSIS — J189 Pneumonia, unspecified organism: Secondary | ICD-10-CM

## 2017-12-21 DIAGNOSIS — J181 Lobar pneumonia, unspecified organism: Principal | ICD-10-CM

## 2017-12-21 NOTE — Telephone Encounter (Signed)
Please advise patient it is time to repeat chest XR to make sure are of pneumonia has cleared. Please enter order.thanks.

## 2017-12-21 NOTE — Telephone Encounter (Signed)
-----   Message from Birdie Sons, MD sent at 12/09/2017  8:34 AM EDT ----- Regarding: FW: repeat cxr after 12/10/2017   ----- Message ----- From: Birdie Sons, MD Sent: 12/09/2017 To: Birdie Sons, MD Subject: FW: repeat cxr 12/10/2017                         ----- Message ----- From: Birdie Sons, MD Sent: 11/26/2017  11:32 AM To: Birdie Sons, MD Subject: repeat csr 12/10/2017

## 2017-12-21 NOTE — Telephone Encounter (Signed)
LMOVM for pt to return call 

## 2017-12-29 NOTE — Telephone Encounter (Signed)
Patient was advised.  

## 2018-01-12 ENCOUNTER — Ambulatory Visit
Admission: RE | Admit: 2018-01-12 | Discharge: 2018-01-12 | Disposition: A | Discharge status: Home / Self Care | Payer: BLUE CROSS/BLUE SHIELD | Source: Ambulatory Visit | Attending: Family Medicine | Admitting: Family Medicine

## 2018-01-12 ENCOUNTER — Ambulatory Visit (INDEPENDENT_AMBULATORY_CARE_PROVIDER_SITE_OTHER): Payer: BLUE CROSS/BLUE SHIELD | Admitting: Family Medicine

## 2018-01-12 DIAGNOSIS — R05 Cough: Secondary | ICD-10-CM | POA: Insufficient documentation

## 2018-01-12 DIAGNOSIS — R059 Cough, unspecified: Secondary | ICD-10-CM

## 2018-01-12 DIAGNOSIS — Z8701 Personal history of pneumonia (recurrent): Secondary | ICD-10-CM | POA: Diagnosis not present

## 2018-01-12 DIAGNOSIS — Z125 Encounter for screening for malignant neoplasm of prostate: Secondary | ICD-10-CM | POA: Diagnosis not present

## 2018-01-12 DIAGNOSIS — R918 Other nonspecific abnormal finding of lung field: Secondary | ICD-10-CM | POA: Insufficient documentation

## 2018-01-12 DIAGNOSIS — Z0000 Encounter for general adult medical examination without abnormal findings: Secondary | ICD-10-CM

## 2018-01-12 DIAGNOSIS — Z09 Encounter for follow-up examination after completed treatment for conditions other than malignant neoplasm: Secondary | ICD-10-CM | POA: Insufficient documentation

## 2018-01-12 LAB — POCT URINALYSIS DIPSTICK
BILIRUBIN UA: NEGATIVE
Blood, UA: NEGATIVE
Glucose, UA: NEGATIVE
Ketones, UA: NEGATIVE
Leukocytes, UA: NEGATIVE
Nitrite, UA: NEGATIVE
PH UA: 6 (ref 5.0–8.0)
PROTEIN UA: NEGATIVE
Spec Grav, UA: 1.02 (ref 1.010–1.025)
UROBILINOGEN UA: 0.2 U/dL

## 2018-01-12 NOTE — Progress Notes (Signed)
Patient: Danny Ortiz, Male    DOB: [birth year], 62 y.o.   MRN: [id number] Visit Date: 01/12/2018  Today's Provider: Wilhemena Durie, MD   Chief Complaint  Patient presents with  . Annual Exam   Subjective:  Danny Ortiz is a 62 y.o. male who presents today for health maintenance and complete physical. He feels well. He reports exercising none. He reports he is sleeping well.  Immunization History  Administered Date(s) Administered  . Influenza Whole 06/14/2017  . Td 09/11/2004  . Tdap 10/10/2014, 06/14/2017   02/15/17 Colonoscopy-Tubular adenoma, repeat 5 years.   Review of Systems  Constitutional: Negative.   HENT: Positive for rhinorrhea and sneezing.   Eyes: Negative.   Respiratory: Positive for cough.        Nonproductive cough for 3 months. It is slowly improving.  Cardiovascular: Negative.   Gastrointestinal: Negative.   Endocrine: Negative.   Genitourinary: Negative.   Musculoskeletal: Negative.   Skin: Negative.        Mild thickening.flaking of some toe nails.  Allergic/Immunologic: Negative.   Neurological: Negative.   Hematological: Negative.   Psychiatric/Behavioral: Negative.     Social History   Socioeconomic History  . Marital status: Married    Spouse name: Not on file  . Number of children: Not on file  . Years of education: Not on file  . Highest education level: Not on file  Occupational History  . Not on file  Social Needs  . Financial resource strain: Not on file  . Food insecurity:    Worry: Not on file    Inability: Not on file  . Transportation needs:    Medical: Not on file    Non-medical: Not on file  Tobacco Use  . Smoking status: Never Smoker  . Smokeless tobacco: Never Used  Substance and Sexual Activity  . Alcohol use: Yes    Alcohol/week: 0.0 oz    Comment: OCCASIONALLY  . Drug use: No  . Sexual activity: Never  Lifestyle  . Physical activity:    Days per week: Not on file    Minutes per session: Not on file  .  Stress: Not on file  Relationships  . Social connections:    Talks on phone: Not on file    Gets together: Not on file    Attends religious service: Not on file    Active member of club or organization: Not on file    Attends meetings of clubs or organizations: Not on file    Relationship status: Not on file  . Intimate partner violence:    Fear of current or ex partner: Not on file    Emotionally abused: Not on file    Physically abused: Not on file    Forced sexual activity: Not on file  Other Topics Concern  . Not on file  Social History Narrative  . Not on file    Patient Active Problem List   Diagnosis Date Noted  . Penile irritation 12/08/2015  . Prostate cancer screening 12/08/2015  . Erectile dysfunction 12/08/2015  . Elevated prolactin level ([place]) 11/25/2015  . Seasonal allergies 02/14/2015  . Clinical depression 02/12/2015  . Testicular dysfunction 02/12/2015  . ED (erectile dysfunction) of organic origin 02/12/2015  . Acid reflux 02/12/2015  . Genital herpes 02/12/2015  . 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 High risk sexual behavior 02/12/2015  . HLD (hyperlipidemia) 02/12/2015  . 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 Cannot sleep 02/12/2015  . Auditory vertigo 02/12/2015  . Mild  major depression ([place]) 02/12/2015  . Obstructive apnea 02/12/2015  . Snores 02/12/2015  . Episode of syncope 02/12/2015  . Trigeminal neuralgia 02/12/2015  . Avitaminosis D 02/12/2015    Past Surgical History:  Procedure Laterality Date  . TONSILLECTOMY AND ADENOIDECTOMY      His family history includes Colon polyps in his maternal grandmother; Heart disease in his mother; Lymphoma in his mother; Prostate cancer in his maternal grandfather.     Outpatient Encounter Medications as of 01/12/2018  Medication Sig  . azelastine (ASTELIN) 0.1 % nasal spray Place into the nose.  Marland Kitchen omeprazole (PRILOSEC) 20 MG capsule Take 20 mg by mouth daily. prn  . sildenafil (REVATIO) 20 MG tablet TAKE 1-5  TABLETS BY MOUTH DAILY AS NEEDED  . valACYclovir (VALTREX) 500 MG tablet Take 1 tablet (500 mg total) by mouth 3 (three) times daily.  . [DISCONTINUED] Ascorbic Acid (VITAMIN C) 100 MG tablet Take 100 mg by mouth daily.  . [DISCONTINUED] fexofenadine (ALLEGRA ALLERGY) 180 MG tablet Take 1 tablet (180 mg total) by mouth daily.  . [DISCONTINUED] HYDROcodone-homatropine (HYCODAN) 5-1.5 MG/5ML syrup Take 5 mLs by mouth every 8 (eight) hours as needed.  . [DISCONTINUED] loratadine (CLARITIN) 10 MG tablet Take 1 tablet (10 mg total) by mouth daily.  . [DISCONTINUED] montelukast (SINGULAIR) 10 MG tablet Take 1 tablet (10 mg total) by mouth at bedtime.  . [DISCONTINUED] zolpidem (AMBIEN) 10 MG tablet TAKE 1 TABLET BY MOUTH AT BEDTIME   No facility-administered encounter medications on file as of 01/12/2018.     Patient Care Team: Jerrol Banana., MD as PCP - General (Family Medicine)      Objective:   Vitals: There were no vitals filed for this visit.  Physical Exam  Constitutional: He is oriented to person, place, and time. He appears well-developed and well-nourished.  HENT:  Head: Normocephalic and atraumatic.  Right Ear: External ear normal.  Left Ear: External ear normal.  Nose: Nose normal.  Mouth/Throat: Oropharynx is clear and moist.  Eyes: Pupils are equal, round, and reactive to light. Conjunctivae and EOM are normal.  Neck: Normal range of motion. Neck supple.  Cardiovascular: Normal rate, regular rhythm, normal heart sounds and intact distal pulses.  Pulmonary/Chest: Effort normal and breath sounds normal.  Abdominal: Soft. Bowel sounds are normal.  Genitourinary: Rectum normal, prostate normal and penis normal.  Musculoskeletal: Normal range of motion.  Neurological: He is alert and oriented to person, place, and time.  Skin: Skin is warm and dry.  Psychiatric: He has a normal mood and affect. His behavior is normal. Judgment and thought content normal.      Depression Screen PHQ 2/9 Scores 01/12/2018 12/30/2016 05/06/2015  PHQ - 2 Score 0 0 0  PHQ- 9 Score 0 1 -      Assessment & Plan:     Routine Health Maintenance and Physical Exam  Exercise Activities and Dietary recommendations Goals    None      Immunization History  Administered Date(s) Administered  . Influenza Whole 06/14/2017  . Td 09/11/2004  . Tdap 10/10/2014, 06/14/2017    Health Maintenance  Topic Date Due  . INFLUENZA VACCINE  03/30/2018  . COLONOSCOPY  02/15/2022  . TETANUS/TDAP  06/15/2027  . Hepatitis C Screening  Completed  . HIV Screening  Completed     Discussed health benefits of physical activity, and encouraged him to engage in regular exercise appropriate for his age and condition.  Cough Improving--pt had Doxy then Levaquin then  Ceftin.Pt declines prednisone taper due to side effects. Repeat CXR. May need spirometry or pulmonary referral. RTC 1 month.   I have done the exam and reviewed the chart and it is accurate to the best of my knowledge. Development worker, community has been used and  any errors in dictation or transcription are unintentional. Miguel Aschoff M.D. [facility]

## 2018-01-13 LAB — COMPREHENSIVE METABOLIC PANEL
A/G RATIO: 2.6 — AB (ref 1.2–2.2)
ALBUMIN: 4.6 g/dL (ref 3.6–4.8)
ALK PHOS: 67 IU/L (ref 39–117)
ALT: 14 IU/L (ref 0–44)
AST: 14 IU/L (ref 0–40)
BUN / CREAT RATIO: 16 (ref 10–24)
BUN: 14 mg/dL (ref 8–27)
Bilirubin Total: 0.5 mg/dL (ref 0.0–1.2)
CO2: 26 mmol/L (ref 20–29)
Calcium: 9.4 mg/dL (ref 8.6–10.2)
Chloride: 104 mmol/L (ref 96–106)
Creatinine, Ser: 0.89 mg/dL (ref 0.76–1.27)
GFR calc Af Amer: 105 mL/min/{1.73_m2} (ref >59)
GFR calc non Af Amer: 91 mL/min/{1.73_m2} (ref >59)
GLOBULIN, TOTAL: 1.8 g/dL (ref 1.5–4.5)
Glucose: 82 mg/dL (ref 65–99)
Potassium: 4.5 mmol/L (ref 3.5–5.2)
Sodium: 144 mmol/L (ref 134–144)
Total Protein: 6.4 g/dL (ref 6.0–8.5)

## 2018-01-13 LAB — CBC WITH DIFFERENTIAL/PLATELET
BASOS: 1 %
Basophils Absolute: 0 10*3/uL (ref 0.0–0.2)
EOS (ABSOLUTE): 0.2 10*3/uL (ref 0.0–0.4)
EOS: 3 %
HEMATOCRIT: 41.6 % (ref 37.5–51.0)
HEMOGLOBIN: 14.7 g/dL (ref 13.0–17.7)
Immature Grans (Abs): 0 10*3/uL (ref 0.0–0.1)
Immature Granulocytes: 0 %
LYMPHS ABS: 1.6 10*3/uL (ref 0.7–3.1)
Lymphs: 23 %
MCH: 31.4 pg (ref 26.6–33.0)
MCHC: 35.3 g/dL (ref 31.5–35.7)
MCV: 89 fL (ref 79–97)
MONOS ABS: 0.7 10*3/uL (ref 0.1–0.9)
Monocytes: 10 %
NEUTROS ABS: 4.4 10*3/uL (ref 1.4–7.0)
Neutrophils: 63 %
Platelets: 219 10*3/uL (ref 150–379)
RBC: 4.68 x10E6/uL (ref 4.14–5.80)
RDW: 13 % (ref 12.3–15.4)
WBC: 7 10*3/uL (ref 3.4–10.8)

## 2018-01-13 LAB — TSH: TSH: 2.15 u[IU]/mL (ref 0.450–4.500)

## 2018-01-13 LAB — LIPID PANEL WITH LDL/HDL RATIO
Cholesterol, Total: 198 mg/dL (ref 100–199)
HDL: 36 mg/dL — AB (ref >39)
LDL Calculated: 125 mg/dL — ABNORMAL HIGH (ref 0–99)
LDL/HDL RATIO: 3.5 ratio (ref 0.0–3.6)
Triglycerides: 183 mg/dL — ABNORMAL HIGH (ref 0–149)
VLDL CHOLESTEROL CAL: 37 mg/dL (ref 5–40)

## 2018-01-13 LAB — PSA: Prostate Specific Ag, Serum: 1.8 ng/mL (ref 0.0–4.0)

## 2018-01-16 NOTE — Progress Notes (Signed)
Advised  ED 

## 2018-01-16 NOTE — Progress Notes (Signed)
Advised 

## 2018-01-17 ENCOUNTER — Telehealth: Payer: Self-pay | Admitting: Family Medicine

## 2018-01-17 NOTE — Telephone Encounter (Signed)
Albuterol MDI 1-2 puffs q 4 hrs prn

## 2018-01-17 NOTE — Telephone Encounter (Signed)
Dr Darnell Level I thought he said it was improving but I may be wrong.  Do you want to send anything in

## 2018-01-17 NOTE — Telephone Encounter (Signed)
Pt called saying he was in last week for physical and he talked to Dr. Rosanna Randy about his cough. Dr. Rosanna Randy did not prescribe anything at that time.  He is wanting to know if he needs to get an inhaler or something for the cough.  He does not want prednisone.  [facility]  Thanks Con Memos

## 2018-01-18 MED ORDER — ALBUTEROL SULFATE HFA 108 (90 BASE) MCG/ACT IN AERS: 2.0000 | INHALATION_SPRAY | Freq: Four times a day (QID) | RESPIRATORY_TRACT | 0 refills | Status: DC | PRN

## 2018-01-24 ENCOUNTER — Telehealth: Payer: Self-pay | Admitting: Family Medicine

## 2018-01-24 DIAGNOSIS — R053 Chronic cough: Secondary | ICD-10-CM

## 2018-01-24 DIAGNOSIS — R05 Cough: Secondary | ICD-10-CM

## 2018-01-24 NOTE — Telephone Encounter (Signed)
Pt called saying he still has a cough.  Dr. Rosanna Randy called in an inhaler last week.  He says he does not have breathing problems but a cough that  Has been persistent since Feb.  Please advise  [phone]  Thanks teri

## 2018-01-24 NOTE — Telephone Encounter (Signed)
Please advise 

## 2018-01-25 NOTE — Telephone Encounter (Signed)
Dr Rosanna Randy,  Please review

## 2018-01-25 NOTE — Telephone Encounter (Signed)
Dr Rosanna Randy he is asking to be referred

## 2018-01-25 NOTE — Telephone Encounter (Signed)
Albuterol MDI q 4 hrs prn cough. May need pulmonary referral.

## 2018-01-25 NOTE — Telephone Encounter (Signed)
Pt called back today.  He wants to be referred to someone for this persistent cough.    Call back is [phone]  Thanks teri

## 2018-01-26 NOTE — Telephone Encounter (Signed)
ok 

## 2018-01-26 NOTE — Telephone Encounter (Signed)
Referral to pulmonology ordered

## 2018-01-29 NOTE — Progress Notes (Signed)
[facility] Consultation      Assessment and Plan:  Chronic cough.  -Suspect that this may be contributed to by chronic rhinitis and/or GERD. - I have asked patient to take his Prilosec and to start Nasacort, use above regularly.  I will start him on Tessalon.  Emphysema.  -Mild hyperinflation seen on chest x-ray, suggestive of COPD.  Patient is a lifelong non-smoker with no secondhand or occupational exposures. - We will send for pulmonary function test.  Chronic/allergic rhinitis.  - May be contributing to cough. - We will start nasal steroid.  OSA.  - Patient had 2 previous sleep studies which were positive for sleep apnea.  He believes one was at sleep med, another was a home sleep test.  On both occasions he was started on CPAP but stopped it within a few weeks because of intolerance of pressures. - Discussed possible treatment in future, patient does not think he wants to try CPAP again, at next visit we will consider a referral to a dentist for a dental device.  Orders Placed This Encounter  Procedures  . Pulmonary Function Test [facility] Only   Meds ordered this encounter  Medications  . benzonatate (TESSALON PERLES) 100 MG capsule    Sig: Take 2 capsules (200 mg total) by mouth 3 (three) times daily.    Dispense:  90 capsule    Refill:  2   Return in about 6 weeks (around 03/13/2018).   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 Consult    [facility]-persistent cough    HPI:   Patient is a 62 year old male referred by his primary care physician.  Patient has had a persistent cough, he was treated with 2 course of antibiotics including Doxy then Levaquin then Ceftin.Pt declined prednisone taper due to side effects. The cough has persisted since February  of this year. It comes in spasms, it started as a chest cold and was initially told that he may have pneumonia. He has never been a smoker.  He has 1 cat, a dog and chinchilla, dog sleeps next to bed. He has nasal drainage, he takes occasional nasal spray.  He does have reflux, controlled with prilosec. He takes it 3 days per week.  He has never been tested for allergies. He does note that the pollen was affecting him this past season.  He has never been a smoker, no second hand exposures. 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  He take occasional robutussin which helps somewhat.   He snores at night, his mouth gets very dry at night. He has been tested for OSA, he was tried on CPAP twice but he could not tolerate it.   Imaging personally reviewed, chest x-ray 01/12/2018; questionable hyperinflation, otherwise unremarkable.   PMHX:   Past Medical History:  Diagnosis Date  . Balanitis   . Bronchitis   . Heartburn   . Sleep apnea    Surgical Hx:  Past Surgical History:  Procedure Laterality Date  . TONSILLECTOMY AND ADENOIDECTOMY     Family Hx:  Family History  Problem Relation Age of Onset  . Heart disease Mother   . Lymphoma Mother   . Colon polyps Maternal Grandmother   . Prostate cancer Maternal Grandfather   .  Kidney disease Neg Hx    Social Hx:   Social History   Tobacco Use  . Smoking status: Never Smoker  . Smokeless tobacco: Never Used  Substance Use Topics  . Alcohol use: Yes    Alcohol/week: 0.0 oz    Comment: OCCASIONALLY  . Drug use: No   Medication:    Current Outpatient Medications:  .  albuterol (PROVENTIL HFA;VENTOLIN HFA) 108 (90 Base) MCG/ACT inhaler, Inhale 2 puffs into the lungs every 6 (six) hours as needed for wheezing or shortness of breath., Disp: 1 Inhaler, Rfl: 0 .  azelastine (ASTELIN) 0.1 % nasal spray, Place into the nose., Disp: , Rfl:  .  omeprazole (PRILOSEC) 20 MG capsule, Take 20 mg by mouth daily. prn,  Disp: , Rfl:  .  sildenafil (REVATIO) 20 MG tablet, TAKE 1-5 TABLETS BY MOUTH DAILY AS NEEDED, Disp: 50 tablet, Rfl: 10 .  valACYclovir (VALTREX) 500 MG tablet, Take 1 tablet (500 mg total) by mouth 3 (three) times daily., Disp: 270 tablet, Rfl: 3   Allergies:  Azithromycin; Penicillins; Prednisone; and Wellbutrin [bupropion]  Review of Systems: Gen:  Denies  fever, sweats, chills HEENT: Denies blurred vision, double vision. bleeds, sore throat Cvc:  No dizziness, chest pain. Resp:   Denies cough or sputum production, shortness of breath Gi: Denies swallowing difficulty, stomach pain. Gu:  Denies bladder incontinence, burning urine Ext:   No Joint pain, stiffness. Skin: No skin rash,  hives  Endoc:  No polyuria, polydipsia. Psych: No depression, insomnia. Other:  All other systems were reviewed with the patient and were negative other that what is mentioned in the HPI.   Physical Examination:   VS: BP 110/60 (BP Location: Left Arm, Cuff Size: Normal)   Pulse 64   Ht 5\' 10"  (1.778 m)   Wt 188 lb (85.3 kg)   SpO2 97%   BMI 26.98 kg/m   General Appearance: No distress  Neuro:without focal findings,  speech normal,  HEENT: PERRLA, EOM intact.   Pulmonary: normal breath sounds, No wheezing.  CardiovascularNormal S1,S2.  No m/r/g.   Abdomen: Benign, Soft, non-tender. Renal:  No costovertebral tenderness  GU:  No performed at this time. Endoc: No evident thyromegaly, no signs of acromegaly. Skin:   warm, no rashes, no ecchymosis  Extremities: normal, no cyanosis, clubbing.  Other findings:    LABORATORY PANEL:   CBC No results for input(s): WBC, HGB, HCT, PLT in the last 168 hours. ------------------------------------------------------------------------------------------------------------------  Chemistries  No results for input(s): NA, K, CL, CO2, GLUCOSE, BUN, CREATININE, CALCIUM, MG, AST, ALT, ALKPHOS, BILITOT in the last 168 hours.  Invalid input(s):  GFRCGP ------------------------------------------------------------------------------------------------------------------  Cardiac Enzymes No results for input(s): TROPONINI in the last 168 hours. ------------------------------------------------------------  RADIOLOGY:  No results found.     Thank  you for the consultation and for allowing [facility] to assist in the care of your patient. Our recommendations are noted above.  Please contact us if we can be of further service.   [name], MD.  Board Certified in Internal Medicine, Pulmonary Medicine, [place], and Sleep Medicine.  [facility] Office Number: [phone]  [name], M.D.  [name], M.D  01/30/2018

## 2018-01-30 ENCOUNTER — Ambulatory Visit: Payer: BLUE CROSS/BLUE SHIELD | Admitting: Internal Medicine

## 2018-01-30 ENCOUNTER — Encounter: Payer: Self-pay | Admitting: Internal Medicine

## 2018-01-30 VITALS — BP 110/60 | HR 64 | Ht 70.0 in | Wt 188.0 lb

## 2018-01-30 DIAGNOSIS — J449 Chronic obstructive pulmonary disease, unspecified: Secondary | ICD-10-CM

## 2018-01-30 MED ORDER — BENZONATATE 100 MG PO CAPS: 200.0000 mg | ORAL_CAPSULE | Freq: Three times a day (TID) | ORAL | 2 refills | Status: AC

## 2018-01-30 NOTE — Patient Instructions (Addendum)
Take prilosec daily.  Take Nasacort 2 sprays in each nostril every night.  Start tessalon (benzonatate) three times daily.

## 2018-02-13 ENCOUNTER — Ambulatory Visit: Payer: Self-pay | Admitting: Family Medicine

## 2018-03-07 ENCOUNTER — Ambulatory Visit: Payer: BLUE CROSS/BLUE SHIELD | Attending: Internal Medicine

## 2018-03-10 NOTE — Progress Notes (Deleted)
[facility] Consultation      Assessment and Plan:  Chronic cough.  -Suspect that this may be contributed to by chronic rhinitis and/or GERD. - I have asked patient to take his Prilosec and to start Nasacort, use above regularly.  I will start him on Tessalon.  Emphysema.  -Mild hyperinflation seen on chest x-ray, suggestive of COPD.  Patient is a lifelong non-smoker with no secondhand or occupational exposures. - We will send for pulmonary function test.  Chronic/allergic rhinitis.  - May be contributing to cough. - We will start nasal steroid.  OSA.  - Patient had 2 previous sleep studies which were positive for sleep apnea.  He believes one was at sleep med, another was a home sleep test.  On both occasions he was started on CPAP but stopped it within a few weeks because of intolerance of pressures. - Discussed possible treatment in future, patient does not think he wants to try CPAP again, at next visit we will consider a referral to a dentist for a dental device.  No orders of the defined types were placed in this encounter.  No orders of the defined types were placed in this encounter.  No follow-ups on file.   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   HPI:  The patient is a 62 year old male, previously seen about a month ago for persistent cough.  He was started empirically on Tessalon, sent for PFT, started on nasal steroid.   He snores at night, his mouth gets very dry at night. He has been tested for OSA, he was tried on CPAP twice but he could not tolerate it.   Imaging personally reviewed, chest x-ray 01/12/2018; questionable hyperinflation, otherwise unremarkable.  Medication:    Current Outpatient Medications:  .  albuterol (PROVENTIL HFA;VENTOLIN HFA) 108 (90 Base)  MCG/ACT inhaler, Inhale 2 puffs into the lungs every 6 (six) hours as needed for wheezing or shortness of breath., Disp: 1 Inhaler, Rfl: 0 .  azelastine (ASTELIN) 0.1 % nasal spray, Place into the nose., Disp: , Rfl:  .  benzonatate (TESSALON PERLES) 100 MG capsule, Take 2 capsules (200 mg total) by mouth 3 (three) times daily., Disp: 90 capsule, Rfl: 2 .  omeprazole (PRILOSEC) 20 MG capsule, Take 20 mg by mouth daily. prn, Disp: , Rfl:  .  sildenafil (REVATIO) 20 MG tablet, TAKE 1-5 TABLETS BY MOUTH DAILY AS NEEDED, Disp: 50 tablet, Rfl: 10 .  valACYclovir (VALTREX) 500 MG tablet, Take 1 tablet (500 mg total) by mouth 3 (three) times daily., Disp: 270 tablet, Rfl: 3   Allergies:  Azithromycin; Penicillins; Prednisone; and Wellbutrin [bupropion]  Review of Systems: Gen:  Denies  fever, sweats, chills HEENT: Denies blurred vision, double vision. bleeds, sore throat Cvc:  No dizziness, chest pain. Resp:   Denies cough or sputum production, shortness of breath Gi: Denies swallowing difficulty, stomach pain. Gu:  Denies bladder incontinence, burning urine Ext:   No Joint pain, stiffness. Skin: No skin rash,  hives  Endoc:  No polyuria, polydipsia. Psych: No depression, insomnia. Other:  All other systems were reviewed with the patient and were negative other that what is mentioned in the HPI.   Physical Examination:   VS: There were no vitals taken for this visit.  General Appearance: No distress  Neuro:without focal findings,  speech normal,  HEENT: PERRLA, EOM intact.   Pulmonary: normal breath sounds, No wheezing.  CardiovascularNormal S1,S2.  No m/r/g.   Abdomen: Benign, Soft, non-tender. Renal:  No costovertebral tenderness  GU:  No performed at this time. Endoc: No evident thyromegaly, no signs of acromegaly. Skin:   warm, no rashes, no ecchymosis  Extremities: normal, no cyanosis, clubbing.  Other findings:    LABORATORY PANEL:   CBC No results for input(s): WBC, HGB,  HCT, PLT in the last 168 hours. ------------------------------------------------------------------------------------------------------------------  Chemistries  No results for input(s): NA, K, CL, CO2, GLUCOSE, BUN, CREATININE, CALCIUM, MG, AST, ALT, ALKPHOS, BILITOT in the last 168 hours.  Invalid input(s): GFRCGP ------------------------------------------------------------------------------------------------------------------  Cardiac Enzymes No results for input(s): TROPONINI in the last 168 hours. ------------------------------------------------------------  RADIOLOGY:  No results found.     Thank  you for the consultation and for allowing [name] to assist in the care of your patient. Our recommendations are noted above.  Please contact us if we can be of further service.   [name], MD.  Board Certified in Internal Medicine, Pulmonary Medicine, [place], and Sleep Medicine.  [facility] Office Number: [phone]  [name], M.D.  [name], M.D  03/10/2018

## 2018-03-13 ENCOUNTER — Telehealth: Payer: Self-pay | Admitting: Internal Medicine

## 2018-03-13 ENCOUNTER — Ambulatory Visit: Payer: BLUE CROSS/BLUE SHIELD | Admitting: Internal Medicine

## 2018-03-13 NOTE — Telephone Encounter (Signed)
ATC patient to R/S PFT appointment and ROV. No answer and VM is full. Unable to leave a message. Will attempt to contact patient tomorrow. Rhonda J Cobb

## 2018-03-13 NOTE — Telephone Encounter (Signed)
Patient calling and had completely forgot about his appointments  Will need to reschedule a PFT then have a follow up Please call to discuss

## 2018-03-14 NOTE — Telephone Encounter (Signed)
ATC patient x 3. LMOVM to arrange for PFT and ROV. Advised patient that this is my 3rd and final attempt to reach him to reschedule these. Advised patient that he can reach me at [phone] to R/S these appointments when he gets ready to schedule.  Nothing else needed at this time. Rhonda J Cobb

## 2018-03-14 NOTE — Telephone Encounter (Signed)
ATC patient today at 9:21 am.  No answer, but was able to leave a message on pt's voice mail.  Waiting on pt to return my call. Rhonda J Cobb

## 2018-03-21 ENCOUNTER — Ambulatory Visit: Payer: BLUE CROSS/BLUE SHIELD | Attending: Internal Medicine

## 2018-03-21 DIAGNOSIS — J449 Chronic obstructive pulmonary disease, unspecified: Secondary | ICD-10-CM | POA: Diagnosis not present

## 2018-03-21 MED ORDER — ALBUTEROL SULFATE (2.5 MG/3ML) 0.083% IN NEBU
2.5000 mg | INHALATION_SOLUTION | Freq: Once | RESPIRATORY_TRACT | Status: AC
  Administered 2018-03-21: 2.5 mg via RESPIRATORY_TRACT

## 2018-03-22 NOTE — Progress Notes (Signed)
[facility]     Assessment and Plan:  Chronic cough.  -Suspect that this may be contributed to by chronic rhinitis and/or GERD. - I have asked patient to take his Prilosec and to start Nasacort, use above regularly.  I will start him on Tessalon.  Emphysema.  -Mild hyperinflation seen on chest x-ray, suggestive of COPD.  Patient is a lifelong non-smoker with no secondhand or occupational exposures. - We will send for pulmonary function test.  Chronic/allergic rhinitis.  - May be contributing to cough. - We will start nasal steroid.  OSA.  - Patient had 2 previous sleep studies which were positive for sleep apnea.  He believes one was at sleep med, another was a home sleep test.  On both occasions he was started on CPAP but stopped it within a few weeks because of intolerance of pressures. - Discussed possible treatment in future, patient does not think he wants to try CPAP again, at next visit we will consider a referral to a dentist for a dental device.  No orders of the defined types were placed in this encounter.  No orders of the defined types were placed in this encounter.  No follow-ups on file.   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 Follow-up    PFT results: SOB at times: feels like it is hard take a deep breath in: cough gone: chest tightness    HPI:  The patient is a 62 year old male, previously seen about a month ago for persistent cough.  He was started empirically on Tessalon, sent for PFT, started on nasal steroid.  Today he notes that the cough has resolved. He is shown his PFT which showed obstruction but his FEV1 was normal. Therefore I would not really diagnosed him with COPD.  He takes no inhaler.   He snores at night, his mouth gets very dry at  night. He has been tested for OSA, he was tried on CPAP twice but he could not tolerate it.   **chest x-ray 01/12/2018>>questionable hyperinflation, otherwise unremarkable. **PFT 03/21/2018>> FVC is 119% predicted, FEV1 is 94% predicted ratio equals 63%.  No significant improvement with bronchodilator therapy.  Flow volume loop is obstructed.  TLC is 170% predicted, ratio is normal.  Diffusion capacity is normal. -Overall this test shows mild obstruction with normal FEV1 of 94%.  Medication:    Current Outpatient Medications:  .  albuterol (PROVENTIL HFA;VENTOLIN HFA) 108 (90 Base) MCG/ACT inhaler, Inhale 2 puffs into the lungs every 6 (six) hours as needed for wheezing or shortness of breath., Disp: 1 Inhaler, Rfl: 0 .  azelastine (ASTELIN) 0.1 % nasal spray, Place into the nose., Disp: , Rfl:  .  benzonatate (TESSALON PERLES) 100 MG capsule, Take 2 capsules (200 mg total) by mouth 3 (three) times daily., Disp: 90 capsule, Rfl: 2 .  omeprazole (PRILOSEC) 20 MG capsule, Take 20 mg by mouth daily. prn, Disp: , Rfl:  .  sildenafil (REVATIO) 20 MG tablet, TAKE 1-5 TABLETS BY MOUTH DAILY AS NEEDED, Disp: 50 tablet, Rfl: 10 .  valACYclovir (VALTREX) 500 MG tablet, Take 1 tablet (500 mg total) by mouth 3 (three) times daily., Disp: 270 tablet, Rfl: 3   Allergies:  Azithromycin; Penicillins; Prednisone; and Wellbutrin [bupropion]      LABORATORY PANEL:  CBC No results for input(s): WBC, HGB, HCT, PLT in the last 168 hours. ------------------------------------------------------------------------------------------------------------------  Chemistries  No results for input(s): NA, K, CL, CO2, GLUCOSE, BUN, CREATININE, CALCIUM, MG, AST, ALT, ALKPHOS, BILITOT in the last 168 hours.  Invalid input(s): GFRCGP ------------------------------------------------------------------------------------------------------------------  Cardiac Enzymes No results for input(s): TROPONINI in the last 168  hours. ------------------------------------------------------------  RADIOLOGY:  No results found.     Thank  you for the consultation and for allowing [facility] to assist in the care of your patient. Our recommendations are noted above.  Please contact us if we can be of further service.   [name], M.D., F.C.C.P.  Board Certified in Internal Medicine, Pulmonary Medicine, [place], and Sleep Medicine.  [facility] Office Number: [phone]   03/22/2018

## 2018-03-23 ENCOUNTER — Ambulatory Visit: Payer: BLUE CROSS/BLUE SHIELD | Admitting: Internal Medicine

## 2018-03-23 ENCOUNTER — Encounter: Payer: Self-pay | Admitting: Internal Medicine

## 2018-03-23 VITALS — BP 112/80 | HR 69 | Ht 70.0 in | Wt 191.0 lb

## 2018-03-23 DIAGNOSIS — J411 Mucopurulent chronic bronchitis: Secondary | ICD-10-CM | POA: Diagnosis not present

## 2018-03-23 NOTE — Patient Instructions (Signed)
Would recommend 30 min of exercise/activity 3 to 4 days per week.

## 2018-03-28 ENCOUNTER — Other Ambulatory Visit: Payer: Self-pay | Admitting: Family Medicine

## 2018-03-28 DIAGNOSIS — A6000 Herpesviral infection of urogenital system, unspecified: Secondary | ICD-10-CM

## 2018-03-28 NOTE — Telephone Encounter (Signed)
Pt needs refill   Sildenafil 20 mg send to  [facility]   Valacyclovir 500 mg  3 months supply  Send to  [facility]

## 2018-03-29 NOTE — Telephone Encounter (Signed)
Please review. Thanks!  

## 2018-03-30 MED ORDER — SILDENAFIL CITRATE 20 MG PO TABS: ORAL_TABLET | ORAL | 10 refills | Status: DC

## 2018-03-30 MED ORDER — VALACYCLOVIR HCL 500 MG PO TABS: 500.0000 mg | ORAL_TABLET | Freq: Two times a day (BID) | ORAL | 3 refills | Status: AC

## 2018-04-01 ENCOUNTER — Other Ambulatory Visit: Payer: Self-pay | Admitting: Family Medicine

## 2018-04-04 ENCOUNTER — Other Ambulatory Visit: Payer: Self-pay | Admitting: Family Medicine

## 2018-04-04 NOTE — Telephone Encounter (Signed)
This was recently filled at [facility]. See what the story is about pharmacies. If he prefers [facility] then cancel the other prescription.

## 2018-04-04 NOTE — Telephone Encounter (Signed)
Pt states this was picked up, but was going to [facility] expensive [facility] if he did not have a coupon. Advised him to call us when it is time for a refill, and ask [place] to send to [facility].

## 2018-06-14 DIAGNOSIS — Z23 Encounter for immunization: Secondary | ICD-10-CM | POA: Diagnosis not present

## 2018-11-01 DIAGNOSIS — G4733 Obstructive sleep apnea (adult) (pediatric): Secondary | ICD-10-CM | POA: Diagnosis not present

## 2018-11-01 DIAGNOSIS — H6122 Impacted cerumen, left ear: Secondary | ICD-10-CM | POA: Diagnosis not present

## 2019-01-31 DIAGNOSIS — M79674 Pain in right toe(s): Secondary | ICD-10-CM | POA: Diagnosis not present

## 2019-01-31 DIAGNOSIS — B351 Tinea unguium: Secondary | ICD-10-CM | POA: Diagnosis not present

## 2019-01-31 DIAGNOSIS — M79675 Pain in left toe(s): Secondary | ICD-10-CM | POA: Diagnosis not present

## 2019-01-31 DIAGNOSIS — M21071 Valgus deformity, not elsewhere classified, right ankle: Secondary | ICD-10-CM | POA: Diagnosis not present

## 2019-04-25 ENCOUNTER — Other Ambulatory Visit: Payer: Self-pay

## 2019-04-25 NOTE — Telephone Encounter (Signed)
Patient requesting refill on the following the medications sildenafil (REVATIO) 20 MG tablet   [facility]: [facility]

## 2019-04-26 MED ORDER — SILDENAFIL CITRATE 20 MG PO TABS: ORAL_TABLET | ORAL | 0 refills | Status: DC

## 2019-04-26 NOTE — Telephone Encounter (Signed)
Pharmacy requesting refills. Thanks!  

## 2019-06-28 DIAGNOSIS — H903 Sensorineural hearing loss, bilateral: Secondary | ICD-10-CM | POA: Diagnosis not present

## 2019-06-28 DIAGNOSIS — H6123 Impacted cerumen, bilateral: Secondary | ICD-10-CM | POA: Diagnosis not present

## 2019-07-06 DIAGNOSIS — Z20828 Contact with and (suspected) exposure to other viral communicable diseases: Secondary | ICD-10-CM | POA: Diagnosis not present

## 2019-08-20 ENCOUNTER — Other Ambulatory Visit: Payer: Self-pay | Admitting: Family Medicine

## 2019-08-20 NOTE — Telephone Encounter (Signed)
sildenafil (REVATIO) 20 MG tablet      Patient requesting refill.    Pharmacy:  [place] [place] [facility] Phone:  [phone]  Fax:  [phone]

## 2019-09-24 ENCOUNTER — Ambulatory Visit: Payer: BLUE CROSS/BLUE SHIELD | Admitting: Family Medicine

## 2019-11-08 ENCOUNTER — Other Ambulatory Visit: Payer: Self-pay | Admitting: Family Medicine

## 2019-11-08 NOTE — Telephone Encounter (Signed)
Medication: sildenafil (REVATIO) 20 MG tablet [id number]   Has the patient contacted their pharmacy? Yes  (Agent: If no, request that the patient contact the pharmacy for the refill.) (Agent: If yes, when and what did the pharmacy advise?)  Preferred Pharmacy (with phone number or street name): [place], [place]  Phone:  [phone] Fax:  [phone]     Agent: Please be advised that RX refills may take up to 3 business days. We ask that you follow-up with your pharmacy.

## 2019-11-08 NOTE — Telephone Encounter (Signed)
Refill for prn  sildenafil (REVATIO) 20 MG tablet, Next office visit on 11/12/19. LOV 01/12/2018

## 2019-11-09 MED ORDER — SILDENAFIL CITRATE 20 MG PO TABS: ORAL_TABLET | ORAL | 0 refills | Status: DC

## 2019-11-09 NOTE — Telephone Encounter (Signed)
Patient has ov scheduled for 11/12/2019.

## 2019-11-12 ENCOUNTER — Telehealth: Payer: Self-pay

## 2019-11-12 ENCOUNTER — Ambulatory Visit (INDEPENDENT_AMBULATORY_CARE_PROVIDER_SITE_OTHER): Payer: BC Managed Care – PPO | Admitting: Family Medicine

## 2019-11-12 ENCOUNTER — Encounter: Payer: Self-pay | Admitting: Family Medicine

## 2019-11-12 ENCOUNTER — Other Ambulatory Visit: Payer: Self-pay

## 2019-11-12 VITALS — BP 114/71 | HR 57 | Temp 96.8°F | Wt 192.4 lb

## 2019-11-12 DIAGNOSIS — E782 Mixed hyperlipidemia: Secondary | ICD-10-CM | POA: Diagnosis not present

## 2019-11-12 DIAGNOSIS — N529 Male erectile dysfunction, unspecified: Secondary | ICD-10-CM

## 2019-11-12 DIAGNOSIS — E291 Testicular hypofunction: Secondary | ICD-10-CM

## 2019-11-12 DIAGNOSIS — Z0000 Encounter for general adult medical examination without abnormal findings: Secondary | ICD-10-CM | POA: Diagnosis not present

## 2019-11-12 DIAGNOSIS — H81312 Aural vertigo, left ear: Secondary | ICD-10-CM

## 2019-11-12 DIAGNOSIS — E559 Vitamin D deficiency, unspecified: Secondary | ICD-10-CM

## 2019-11-12 LAB — POCT URINALYSIS DIPSTICK
Bilirubin, UA: NEGATIVE
Glucose, UA: NEGATIVE
Ketones, UA: NEGATIVE
Leukocytes, UA: NEGATIVE
Nitrite, UA: NEGATIVE
Protein, UA: NEGATIVE
Spec Grav, UA: 1.015 (ref 1.010–1.025)
Urobilinogen, UA: 0.2 E.U./dL
pH, UA: 6 (ref 5.0–8.0)

## 2019-11-12 LAB — URINALYSIS, MICROSCOPIC ONLY

## 2019-11-12 MED ORDER — TADALAFIL 20 MG PO TABS: 20.0000 mg | ORAL_TABLET | ORAL | 3 refills | Status: DC | PRN

## 2019-11-12 NOTE — Telephone Encounter (Signed)
Patient would like to know if he will need another dose of the shingles vaccine. Please advise?

## 2019-11-12 NOTE — Progress Notes (Signed)
Patient: Danny Ortiz, Male    DOB: Feb 07, 1956, 64 y.o.   MRN: [id number] Visit Date: 11/12/2019  Today's Provider: Wilhemena Durie, MD   Chief Complaint  Patient presents with  . Annual Exam   Subjective:     Complete Physical Danny Ortiz is a 64 y.o. male. He feels well. He reports he is not exercising. He reports he is sleeping fairly well. He is divorced and the father of 1 daughter and 1 son.  No grandchildren.  He has a steady girlfriend for 4 years.  He is still working full-time. He has chronic GERD and allergic rhinitis and a history of low testosterone with mildly elevated prolactin.  No imaging recommended at that time.  Everything is been stable.  A little bit low energy. -----------------------------------------------------------   Review of Systems  Constitutional: Negative.   HENT: Negative.   Eyes: Negative.   Respiratory: Positive for apnea.   Cardiovascular: Negative.   Gastrointestinal: Negative.   Endocrine: Negative.   Genitourinary: Negative.        Some ED.  Musculoskeletal: Negative.   Skin: Negative.   Allergic/Immunologic: Negative.   Neurological: Negative.   Hematological: Negative.   Psychiatric/Behavioral: Negative.     Social History   Socioeconomic History  . Marital status: Married    Spouse name: Not on file  . Number of children: Not on file  . Years of education: Not on file  . Highest education level: Not on file  Occupational History  . Not on file  Tobacco Use  . Smoking status: Never Smoker  . Smokeless tobacco: Never Used  Substance and Sexual Activity  . Alcohol use: Yes    Alcohol/week: 0.0 standard drinks    Comment: OCCASIONALLY  . Drug use: No  . Sexual activity: Never  Other Topics Concern  . Not on file  Social History Narrative  . Not on file   Social Determinants of Health   Financial Resource Strain:   . Difficulty of Paying Living Expenses:   Food Insecurity:   . Worried About  Charity fundraiser in the Last Year:   . Arboriculturist in the Last Year:   Transportation Needs:   . Film/video editor (Medical):   Marland Kitchen Lack of Transportation (Non-Medical):   Physical Activity:   . Days of Exercise per Week:   . Minutes of Exercise per Session:   Stress:   . Feeling of Stress :   Social Connections:   . Frequency of Communication with Friends and Family:   . Frequency of Social Gatherings with Friends and Family:   . Attends Religious Services:   . Active Member of Clubs or Organizations:   . Attends Archivist Meetings:   Marland Kitchen Marital Status:   Intimate Partner Violence:   . Fear of Current or Ex-Partner:   . Emotionally Abused:   Marland Kitchen Physically Abused:   . Sexually Abused:     Past Medical History:  Diagnosis Date  . Balanitis   . Bronchitis   . Heartburn   . Sleep apnea      Patient Active Problem List   Diagnosis Date Noted  . Penile irritation 12/08/2015  . Prostate cancer screening 12/08/2015  . Erectile dysfunction 12/08/2015  . Elevated prolactin level 11/25/2015  . Seasonal allergies 02/14/2015  . Clinical depression 02/12/2015  . Testicular dysfunction 02/12/2015  . ED (erectile dysfunction) of organic origin 02/12/2015  . Acid reflux 02/12/2015  .  Genital herpes 02/12/2015  . 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 High risk sexual behavior 02/12/2015  . HLD (hyperlipidemia) 02/12/2015  . 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 Cannot sleep 02/12/2015  . Auditory vertigo 02/12/2015  . Mild major depression ([place]) 02/12/2015  . Obstructive apnea 02/12/2015  . Snores 02/12/2015  . Episode of syncope 02/12/2015  . Trigeminal neuralgia 02/12/2015  . Avitaminosis D 02/12/2015    Past Surgical History:  Procedure Laterality Date  . TONSILLECTOMY AND ADENOIDECTOMY      His family history includes Colon polyps in his maternal grandmother; Heart disease in his mother; Lymphoma in his mother; Prostate cancer in his maternal grandfather. There  is no history of Kidney disease.   Current Outpatient Medications:  .  albuterol (PROVENTIL HFA;VENTOLIN HFA) 108 (90 Base) MCG/ACT inhaler, Inhale 2 puffs into the lungs every 6 (six) hours as needed for wheezing or shortness of breath., Disp: 1 Inhaler, Rfl: 0 .  azelastine (ASTELIN) 0.1 % nasal spray, Place into the nose., Disp: , Rfl:  .  montelukast (SINGULAIR) 10 MG tablet, TAKE 1 TABLET BY MOUTH EVERYDAY AT BEDTIME, Disp: 90 tablet, Rfl: 2 .  omeprazole (PRILOSEC) 20 MG capsule, Take 20 mg by mouth daily. prn, Disp: , Rfl:  .  sildenafil (REVATIO) 20 MG tablet, TAKE 1-5 TABLETS BY MOUTH DAILY AS NEEDED, Disp: 50 tablet, Rfl: 0 .  valACYclovir (VALTREX) 500 MG tablet, Take 1 tablet (500 mg total) by mouth 2 (two) times daily., Disp: 180 tablet, Rfl: 3  Patient Care Team: Jerrol Banana., MD as PCP - General (Family Medicine)     Objective:    Vitals: There were no vitals taken for this visit.  Physical Exam  Activities of Daily Living No flowsheet data found.  Fall Risk Assessment Fall Risk  01/12/2018 12/30/2016  Falls in the past year? No No     Depression Screen PHQ 2/9 Scores 01/12/2018 12/30/2016 05/06/2015  PHQ - 2 Score 0 0 0  PHQ- 9 Score 0 1 -    No flowsheet data found.     Assessment & Plan:    Annual Physical Reviewed patient's Family Medical History Reviewed and updated list of patient's medical providers Assessment of cognitive impairment was done Assessed patient's functional ability Established a written schedule for health screening [place] Completed and Reviewed  Exercise Activities and Dietary recommendations Goals   None     Immunization History  Administered Date(s) Administered  . Influenza Whole 06/14/2017  . Td 09/11/2004  . Tdap 10/10/2014, 06/14/2017    Health Maintenance  Topic Date Due  . INFLUENZA VACCINE  03/31/2019  . PNA vac Low Risk Adult (1 of 2 - PCV13) Never done  . COLONOSCOPY   02/15/2022  . TETANUS/TDAP  06/15/2027  . Hepatitis C Screening  Completed  . HIV Screening  Completed     Discussed health benefits of physical activity, and encouraged him to engage in regular exercise appropriate for his age and condition.   1. Annual physical exam Baseline labs obtained. - CBC with Differential/Platelet - Comprehensive metabolic panel - TSH - Lipid panel - Testosterone,Free and Total - Prolactin - POCT urinalysis dipstick - Urinalysis, microscopic only  2. ED (erectile dysfunction) of organic origin Change Viagra to generic Cialis.  He is he would use a good Rx card. - CBC with Differential/Platelet - Comprehensive metabolic panel - TSH - Lipid panel - Testosterone,Free and Total - Prolactin - tadalafil (CIALIS) 20 MG tablet; Take 1 tablet (20  mg total) by mouth every three (3) days as needed for erectile dysfunction.  Dispense: 30 tablet; Refill: 3  3. Hypogonadism in male Follow-up levels.  Include prolactin.  Prolactin is up will need to go to endocrine.  If this is stable and not sure patient is interested in treatment at all. - CBC with Differential/Platelet - Comprehensive metabolic panel - TSH - Lipid panel - Testosterone,Free and Total - Prolactin  4. Auditory vertigo involving left ear Chronic, - CBC with Differential/Platelet - Comprehensive metabolic panel - TSH - Lipid panel - Testosterone,Free and Total - Prolactin  5. Moderate mixed hyperlipidemia not requiring statin therapy  - CBC with Differential/Platelet - Comprehensive metabolic panel - TSH - Lipid panel - Testosterone,Free and Total - Prolactin  6. Avitaminosis D  - CBC with Differential/Platelet - Comprehensive metabolic panel - TSH - Lipid panel - Testosterone,Free and Total - Prolactin  ------------------------------------------------------------------------------------------------------------    Wilhemena Durie, MD  [facility]

## 2019-11-13 ENCOUNTER — Telehealth: Payer: Self-pay

## 2019-11-13 DIAGNOSIS — N529 Male erectile dysfunction, unspecified: Secondary | ICD-10-CM

## 2019-11-13 MED ORDER — TADALAFIL 20 MG PO TABS: 20.0000 mg | ORAL_TABLET | ORAL | 3 refills | Status: DC | PRN

## 2019-11-13 NOTE — Telephone Encounter (Signed)
Medication sent to [facility].

## 2019-11-13 NOTE — Telephone Encounter (Signed)
Copied from [place] [phone]. Topic: General - Other >> Nov 13, 2019  2:28 PM Leward Quan A wrote: Reason for CRM: Patient called to say that Rx for tadalafil (CIALIS) 20 MG tablet  sent yesterday to the [facility] pharmacy is too expensive asking Dr to send Rx to [facility] please. Please advise

## 2019-11-14 DIAGNOSIS — E782 Mixed hyperlipidemia: Secondary | ICD-10-CM | POA: Diagnosis not present

## 2019-11-14 DIAGNOSIS — Z0000 Encounter for general adult medical examination without abnormal findings: Secondary | ICD-10-CM | POA: Diagnosis not present

## 2019-11-14 DIAGNOSIS — Z125 Encounter for screening for malignant neoplasm of prostate: Secondary | ICD-10-CM | POA: Diagnosis not present

## 2019-11-14 DIAGNOSIS — E291 Testicular hypofunction: Secondary | ICD-10-CM | POA: Diagnosis not present

## 2019-11-14 DIAGNOSIS — N529 Male erectile dysfunction, unspecified: Secondary | ICD-10-CM | POA: Diagnosis not present

## 2019-11-14 DIAGNOSIS — H81312 Aural vertigo, left ear: Secondary | ICD-10-CM | POA: Diagnosis not present

## 2019-11-17 LAB — COMPREHENSIVE METABOLIC PANEL
ALT: 16 IU/L (ref 0–44)
AST: 13 IU/L (ref 0–40)
Albumin/Globulin Ratio: 2.1 (ref 1.2–2.2)
Albumin: 4.4 g/dL (ref 3.8–4.8)
Alkaline Phosphatase: 74 IU/L (ref 39–117)
BUN/Creatinine Ratio: 17 (ref 10–24)
BUN: 17 mg/dL (ref 8–27)
Bilirubin Total: 0.7 mg/dL (ref 0.0–1.2)
CO2: 26 mmol/L (ref 20–29)
Calcium: 9.1 mg/dL (ref 8.6–10.2)
Chloride: 104 mmol/L (ref 96–106)
Creatinine, Ser: 1 mg/dL (ref 0.76–1.27)
GFR calc Af Amer: 91 mL/min/{1.73_m2} (ref >59)
GFR calc non Af Amer: 79 mL/min/{1.73_m2} (ref >59)
Globulin, Total: 2.1 g/dL (ref 1.5–4.5)
Glucose: 83 mg/dL (ref 65–99)
Potassium: 4.3 mmol/L (ref 3.5–5.2)
Sodium: 143 mmol/L (ref 134–144)
Total Protein: 6.5 g/dL (ref 6.0–8.5)

## 2019-11-17 LAB — CBC WITH DIFFERENTIAL/PLATELET
Basophils Absolute: 0.1 10*3/uL (ref 0.0–0.2)
Basos: 2 %
EOS (ABSOLUTE): 0.2 10*3/uL (ref 0.0–0.4)
Eos: 3 %
Hematocrit: 44.3 % (ref 37.5–51.0)
Hemoglobin: 15.4 g/dL (ref 13.0–17.7)
Immature Grans (Abs): 0 10*3/uL (ref 0.0–0.1)
Immature Granulocytes: 0 %
Lymphocytes Absolute: 1.4 10*3/uL (ref 0.7–3.1)
Lymphs: 22 %
MCH: 31.3 pg (ref 26.6–33.0)
MCHC: 34.8 g/dL (ref 31.5–35.7)
MCV: 90 fL (ref 79–97)
Monocytes Absolute: 0.7 10*3/uL (ref 0.1–0.9)
Monocytes: 10 %
Neutrophils Absolute: 4.2 10*3/uL (ref 1.4–7.0)
Neutrophils: 63 %
Platelets: 225 10*3/uL (ref 150–450)
RBC: 4.92 x10E6/uL (ref 4.14–5.80)
RDW: 12.4 % (ref 11.6–15.4)
WBC: 6.6 10*3/uL (ref 3.4–10.8)

## 2019-11-17 LAB — TESTOSTERONE,FREE AND TOTAL
Testosterone, Free: 9 pg/mL (ref 6.6–18.1)
Testosterone: 296 ng/dL (ref 264–916)

## 2019-11-17 LAB — LIPID PANEL
Chol/HDL Ratio: 5.5 ratio — ABNORMAL HIGH (ref 0.0–5.0)
Cholesterol, Total: 204 mg/dL — ABNORMAL HIGH (ref 100–199)
HDL: 37 mg/dL — ABNORMAL LOW (ref >39)
LDL Chol Calc (NIH): 136 mg/dL — ABNORMAL HIGH (ref 0–99)
Triglycerides: 173 mg/dL — ABNORMAL HIGH (ref 0–149)
VLDL Cholesterol Cal: 31 mg/dL (ref 5–40)

## 2019-11-17 LAB — TSH: TSH: 2.23 u[IU]/mL (ref 0.450–4.500)

## 2019-11-17 LAB — PROLACTIN: Prolactin: 30.8 ng/mL — ABNORMAL HIGH (ref 4.0–15.2)

## 2019-11-19 ENCOUNTER — Telehealth: Payer: Self-pay

## 2019-11-19 NOTE — Telephone Encounter (Signed)
Patient advised and would like to know if he could have a PSA done because he hasn't had one in a while and his father has prostate ca. Please advise?

## 2019-11-19 NOTE — Telephone Encounter (Signed)
Called to let patient know that lab order has been added to previous lab order and he should hear something back soon, no answer and vm full. Will try to contact again later.

## 2019-11-19 NOTE — Telephone Encounter (Signed)
-----   Message from Jerrol Banana., MD sent at 11/19/2019  8:19 AM EDT ----- Labs stable.  Testosterone borderline low.  If patient would like to pursue treatment will need appointment to discuss treatment options.  Could also refer to urology if he prefers.

## 2019-11-19 NOTE — Telephone Encounter (Signed)
Yes--PSA should have been done with other labs.

## 2019-11-20 LAB — SPECIMEN STATUS REPORT

## 2019-11-20 LAB — PSA: Prostate Specific Ag, Serum: 2.1 ng/mL (ref 0.0–4.0)

## 2019-11-22 NOTE — Telephone Encounter (Signed)
Patient advised.

## 2019-11-22 NOTE — Telephone Encounter (Signed)
Patient is returning CMAs call. Call back [phone]

## 2019-11-30 DIAGNOSIS — Z20822 Contact with and (suspected) exposure to covid-19: Secondary | ICD-10-CM | POA: Diagnosis not present

## 2019-11-30 DIAGNOSIS — U071 COVID-19: Secondary | ICD-10-CM | POA: Diagnosis not present

## 2019-12-10 ENCOUNTER — Telehealth: Payer: Self-pay

## 2019-12-10 NOTE — Telephone Encounter (Signed)
Please arrange for someone to do a virtual visit with him regarding his Covid.  Thank you.  I am happy to do it this week if it is available on my schedule.  Otherwise I am sure that someone has an opening on the schedule for tomorrow.

## 2019-12-10 NOTE — Telephone Encounter (Signed)
Copied from [place] [phone]. Topic: General - Other >> Dec 10, 2019  2:33 PM Danny Ortiz wrote: Reason for CRM: Pt called saying he tested positive for Covid on April 2nd and he is still having symptoms.  Fever off and on fatigue and cough, no appetite.   He would like someone to call him back.  CB#  [phone]

## 2019-12-10 NOTE — Telephone Encounter (Signed)
Please advise 

## 2019-12-11 DIAGNOSIS — R05 Cough: Secondary | ICD-10-CM | POA: Diagnosis not present

## 2019-12-11 DIAGNOSIS — Z8616 Personal history of COVID-19: Secondary | ICD-10-CM | POA: Diagnosis not present

## 2019-12-11 DIAGNOSIS — J40 Bronchitis, not specified as acute or chronic: Secondary | ICD-10-CM | POA: Diagnosis not present

## 2019-12-11 DIAGNOSIS — R5383 Other fatigue: Secondary | ICD-10-CM | POA: Diagnosis not present

## 2019-12-11 DIAGNOSIS — R0602 Shortness of breath: Secondary | ICD-10-CM | POA: Diagnosis not present

## 2019-12-11 NOTE — Telephone Encounter (Signed)
Left detailed voicemail advising patient to contact our office back to schedule virtual appointment. I did not see an opening until Monday, all providers this week are fully booked. If this is emergent I will call back and have patient go to Urgent Care, please advise. KW

## 2019-12-11 NOTE — Telephone Encounter (Signed)
LMOVM for pt to call back to schedule a mychart or telephone visit for this afternoon.

## 2019-12-11 NOTE — Telephone Encounter (Signed)
Pt returned Kathleen's call and said he will go to a walk in clinic due to not wanting to wait until Monday

## 2019-12-11 NOTE — Telephone Encounter (Signed)
I have a nurse visit today at 240 I think.  If you can put him in at 3:00 for a virtual visit I think we can do that if they will allow it.

## 2019-12-12 NOTE — Telephone Encounter (Signed)
Patient has been seen at urgent care

## 2020-04-07 DIAGNOSIS — H8309 Labyrinthitis, unspecified ear: Secondary | ICD-10-CM | POA: Diagnosis not present

## 2020-04-07 DIAGNOSIS — R42 Dizziness and giddiness: Secondary | ICD-10-CM | POA: Diagnosis not present

## 2020-04-09 ENCOUNTER — Telehealth: Payer: Self-pay

## 2020-04-09 NOTE — Telephone Encounter (Signed)
Copied from [place] [id number]. Topic: Referral - Request for Referral >> Apr 09, 2020  3:03 PM Erick Blinks wrote: Has patient seen PCP for this complaint? Yes.   *If NO, is insurance requiring patient see PCP for this issue before PCP can refer them? Referral for which specialty: ENT  Preferred provider/office: [facility]  Reason for referral: Pt has been experiencing vertigo, still "wobbly and woozy"

## 2020-04-10 NOTE — Telephone Encounter (Signed)
Needs appointment for evaluation.

## 2020-04-11 ENCOUNTER — Other Ambulatory Visit: Payer: Self-pay

## 2020-04-11 ENCOUNTER — Ambulatory Visit: Payer: BC Managed Care – PPO | Admitting: Physician Assistant

## 2020-04-11 VITALS — BP 102/60 | HR 70 | Temp 98.6°F | Ht 70.0 in | Wt 184.2 lb

## 2020-04-11 DIAGNOSIS — H6123 Impacted cerumen, bilateral: Secondary | ICD-10-CM | POA: Diagnosis not present

## 2020-04-11 DIAGNOSIS — R42 Dizziness and giddiness: Secondary | ICD-10-CM

## 2020-04-11 NOTE — Telephone Encounter (Signed)
Called to advise patient that pcp would like him to have an appointment to be evaluated for referral to ENT. LVMTCB to schedule an appointment. If patient calls back it is okay for pec to schedule patient for appointment.

## 2020-04-11 NOTE — Patient Instructions (Signed)

## 2020-04-11 NOTE — Progress Notes (Signed)
Established patient visit   Patient: Danny Ortiz   DOB: 10/31/1955   64 y.o. Male  MRN: [id number] Visit Date: 04/11/2020  Today's healthcare provider: Trinna Post, PA-C   Chief Complaint  Patient presents with  . Dizziness   Subjective    HPI  Vertigo Patient reports that he has had dizziness X 5 days.Describes room spinning sensation with head movement. He reports that he was seen at the urgent care on 04/07/20 and was prescribed meclizine. He reports that this has not helped. He describes it as feeling "wobbly and woozy". He has vomited once since he has had symptoms. He has had this issue before and believes he had a therapist do head exercised to realign his crystals. Overall, he feels slightly better but not completely improved. Denies focal weakness, vision loss. Is having some imbalance when he has vertigo episodes.     Medications: Outpatient Medications Prior to Visit  Medication Sig  . azelastine (ASTELIN) 0.1 % nasal spray Place into the nose.  Marland Kitchen omeprazole (PRILOSEC) 20 MG capsule Take 20 mg by mouth daily. prn  . sildenafil (REVATIO) 20 MG tablet TAKE 1-5 TABLETS BY MOUTH DAILY AS NEEDED  . tadalafil (CIALIS) 20 MG tablet Take 1 tablet (20 mg total) by mouth every three (3) days as needed for erectile dysfunction.  Marland Kitchen albuterol (PROVENTIL HFA;VENTOLIN HFA) 108 (90 Base) MCG/ACT inhaler Inhale 2 puffs into the lungs every 6 (six) hours as needed for wheezing or shortness of breath. (Patient not taking: Reported on 11/12/2019)  . montelukast (SINGULAIR) 10 MG tablet TAKE 1 TABLET BY MOUTH EVERYDAY AT BEDTIME (Patient not taking: Reported on 11/12/2019)  . valACYclovir (VALTREX) 500 MG tablet Take 1 tablet (500 mg total) by mouth 2 (two) times daily. (Patient not taking: Reported on 11/12/2019)   No facility-administered medications prior to visit.    Review of Systems  Constitutional: Negative.   Respiratory: Negative.   Cardiovascular: Negative.     Neurological: Positive for dizziness, light-headedness and headaches.    {Heme  Chem  Endocrine  Serology  Results Review (optional):[id number]::" "}  Objective    BP 102/60   Pulse 70   Temp 98.6 F (37 C)   Ht 5\' 10"  (1.778 m)   Wt 184 lb 3.2 oz (83.6 kg)   BMI 26.43 kg/m    Physical Exam Constitutional:      Appearance: Normal appearance.  HENT:     Right Ear: There is impacted cerumen.     Left Ear: There is impacted cerumen.  Eyes:     Extraocular Movements: Extraocular movements intact.     Conjunctiva/sclera: Conjunctivae normal.     Pupils: Pupils are equal, round, and reactive to light.  Cardiovascular:     Rate and Rhythm: Normal rate and regular rhythm.     Heart sounds: Normal heart sounds.  Pulmonary:     Effort: Pulmonary effort is normal.     Breath sounds: Normal breath sounds.  Skin:    General: Skin is warm and dry.  Neurological:     Mental Status: He is alert and oriented to person, place, and time. Mental status is at baseline.     Cranial Nerves: No cranial nerve deficit.     Motor: No weakness.     Gait: Gait normal.  Psychiatric:        Mood and Affect: Mood normal.        Behavior: Behavior normal.  No results found for any visits on 04/11/20.  Assessment & Plan    1. Vertigo  Continue meclizine. Advised on Epley maneuvers. If he is not feeling better, please keep referral with ENT.  - Ambulatory referral to ENT  2. Bilateral impacted cerumen  Able to remove some wax with cerumen spoon. Can flush out when feeling better.     Return if symptoms worsen or fail to improve.      ITrinna Post, PA-C, have reviewed all documentation for this visit. The documentation on 04/16/20 for the exam, diagnosis, procedures, and orders are all accurate and complete.  The entirety of the information documented in the History of Present Illness, Review of Systems and Physical Exam were personally obtained by me. Portions of this  information were initially documented by Wilburt Finlay, CMA and reviewed by me for thoroughness and accuracy.   I spent 30 minutes dedicated to the care of this patient on the date of this encounter to include pre-visit review of records, face-to-face time with the patient discussing vertigo, cerumen impaction and removing earwax, and post visit ordering of testing.     Paulene Floor  [facility] [phone] (phone) [phone] (fax)  [place]

## 2020-04-11 NOTE — Telephone Encounter (Signed)
Patient has appointment scheduled on 04/11/20.

## 2020-05-02 DIAGNOSIS — H6123 Impacted cerumen, bilateral: Secondary | ICD-10-CM | POA: Diagnosis not present

## 2020-05-02 DIAGNOSIS — R42 Dizziness and giddiness: Secondary | ICD-10-CM | POA: Diagnosis not present

## 2020-05-27 DIAGNOSIS — Z03818 Encounter for observation for suspected exposure to other biological agents ruled out: Secondary | ICD-10-CM | POA: Diagnosis not present

## 2020-05-27 DIAGNOSIS — J019 Acute sinusitis, unspecified: Secondary | ICD-10-CM | POA: Diagnosis not present

## 2020-07-23 ENCOUNTER — Encounter: Payer: Self-pay | Admitting: Physician Assistant

## 2020-07-23 ENCOUNTER — Ambulatory Visit: Payer: BC Managed Care – PPO | Admitting: Physician Assistant

## 2020-07-23 ENCOUNTER — Other Ambulatory Visit: Payer: Self-pay

## 2020-07-23 VITALS — BP 113/83 | HR 64 | Temp 98.3°F | Wt 190.1 lb

## 2020-07-23 DIAGNOSIS — E785 Hyperlipidemia, unspecified: Secondary | ICD-10-CM | POA: Diagnosis not present

## 2020-07-23 DIAGNOSIS — M542 Cervicalgia: Secondary | ICD-10-CM | POA: Diagnosis not present

## 2020-07-23 MED ORDER — CYCLOBENZAPRINE HCL 5 MG PO TABS: 5.0000 mg | ORAL_TABLET | Freq: Three times a day (TID) | ORAL | 1 refills | Status: DC | PRN

## 2020-07-23 NOTE — Progress Notes (Signed)
Established patient visit   Patient: Danny Ortiz   DOB: 06-Sep-1955   64 y.o. Male  MRN: [id number] Visit Date: 07/23/2020  Today's healthcare provider: Trinna Post, PA-C   Chief Complaint  Patient presents with  . Neck Pain   Subjective    HPI  Patient presents today for behind left ear and states the pain is constant/ burning for 3 weeks now. He denies putting anything on the area. He had previously seen Dr. Richardson Landry at [facility] and was told his ear nose and throat were fine. ENT thought patient had pain coming from his jaw. He denies grinding his teeth at night. He denies fevers, chills. He is concerned that he may have carotid artery disease. Carotid US from 2014 showed no significant vessel disease. Patient denies sudden loss of vision, headache, dizziness.   Lipid/Cholesterol, Follow-up  Last lipid panel Other pertinent labs  Lab Results  Component Value Date   CHOL 204 (H) 11/14/2019   HDL 37 (L) 11/14/2019   LDLCALC 136 (H) 11/14/2019   TRIG 173 (H) 11/14/2019   CHOLHDL 5.5 (H) 11/14/2019   Lab Results  Component Value Date   ALT 16 11/14/2019   AST 13 11/14/2019   PLT 225 11/14/2019   TSH 2.230 11/14/2019     Symptoms: No chest pain No chest pressure/discomfort  No dyspnea No lower extremity edema  No numbness or tingling of extremity No orthopnea  No palpitations No paroxysmal nocturnal dyspnea  No speech difficulty No syncope   Current diet: well balanced Current exercise: aerobics  The 10-year ASCVD risk score Mikey Bussing DC Jr., et al., 2013) is: 13.3%  ---------------------------------------------------------------------------------------------------     Medications: Outpatient Medications Prior to Visit  Medication Sig  . omeprazole (PRILOSEC) 20 MG capsule Take 20 mg by mouth daily. prn  . sildenafil (REVATIO) 20 MG tablet TAKE 1-5 TABLETS BY MOUTH DAILY AS NEEDED  . tadalafil (CIALIS) 20 MG tablet Take 1 tablet (20 mg total) by mouth  every three (3) days as needed for erectile dysfunction.  . valACYclovir (VALTREX) 500 MG tablet Take 1 tablet (500 mg total) by mouth 2 (two) times daily.  Marland Kitchen albuterol (PROVENTIL HFA;VENTOLIN HFA) 108 (90 Base) MCG/ACT inhaler Inhale 2 puffs into the lungs every 6 (six) hours as needed for wheezing or shortness of breath. (Patient not taking: Reported on 11/12/2019)  . azelastine (ASTELIN) 0.1 % nasal spray Place into the nose. (Patient not taking: Reported on 07/23/2020)  . montelukast (SINGULAIR) 10 MG tablet TAKE 1 TABLET BY MOUTH EVERYDAY AT BEDTIME (Patient not taking: Reported on 11/12/2019)   No facility-administered medications prior to visit.    Review of Systems  Constitutional: Negative.   Respiratory: Negative.   Hematological: Negative.       Objective    BP 113/83 (BP Location: Right Arm, Patient Position: Sitting, Cuff Size: Large)   Pulse 64   Temp 98.3 F (36.8 C) (Oral)   Wt 190 lb 1.6 oz (86.2 kg)   SpO2 100%   BMI 27.28 kg/m    Physical Exam Constitutional:      Appearance: Normal appearance. He is normal weight.  HENT:     Head:     Jaw: There is normal jaw occlusion. Tenderness present.     Right Ear: Tympanic membrane and ear canal normal.     Left Ear: Tympanic membrane and ear canal normal.  Neck:     Vascular: Normal carotid pulses. No carotid bruit or JVD.  Cardiovascular:     Rate and Rhythm: Normal rate and regular rhythm.     Heart sounds: Normal heart sounds.  Pulmonary:     Effort: Pulmonary effort is normal.     Breath sounds: Normal breath sounds.  Skin:    General: Skin is warm and dry.  Neurological:     General: No focal deficit present.     Mental Status: He is alert and oriented to person, place, and time. Mental status is at baseline.  Psychiatric:        Mood and Affect: Mood normal.        Behavior: Behavior normal.       No results found for any visits on 07/23/20.  Assessment & Plan    1. Neck pain  Suspect his  pain is coming from TMJ dysfunction. Trial of muscle relaxer. Do not overly suspect atherosclerosis however patient remains concerned so will order below. Did discuss his CVD risk is 13.3% at which point we would generally recommend statin usage to prevent CVD. Patient would like to try dietary intervention and recheck at f/u. Recommend he discuss with PCP.   - US Carotid Duplex Bilateral; Future - cyclobenzaprine (FLEXERIL) 5 MG tablet; Take 1 tablet (5 mg total) by mouth 3 (three) times daily as needed for muscle spasms.  Dispense: 30 tablet; Refill: 1  2. Hyperlipidemia, unspecified hyperlipidemia type    Return if symptoms worsen or fail to improve.      ITrinna Post, PA-C, have reviewed all documentation for this visit. The documentation on 07/29/20 for the exam, diagnosis, procedures, and orders are all accurate and complete.  The entirety of the information documented in the History of Present Illness, Review of Systems and Physical Exam were personally obtained by me. Portions of this information were initially documented by [facility] and reviewed by me for thoroughness and accuracy.     Paulene Floor  [facility] [phone] (phone) [phone] (fax)  [place]

## 2020-07-23 NOTE — Patient Instructions (Addendum)
Musculoskeletal Pain Musculoskeletal pain refers to aches and pains in your bones, joints, muscles, and the tissues that surround them. This pain can occur in any part of the body. It can last for a short time (acute) or a long time (chronic). A physical exam, lab tests, and imaging studies may be done to find the cause of your musculoskeletal pain. Follow these instructions at home:  Lifestyle  Try to control or lower your stress levels. Stress increases muscle tension and can worsen musculoskeletal pain. It is important to recognize when you are anxious or stressed and learn ways to manage it. This may include: ? Meditation or yoga. ? Cognitive or behavioral therapy. ? Acupuncture or massage therapy.  You may continue all activities unless the activities cause more pain. When the pain gets better, slowly resume your normal activities. Gradually increase the intensity and duration of your activities or exercise. Managing pain, stiffness, and swelling  Take over-the-counter and prescription medicines only as told by your health care provider.  When your pain is severe, bed rest may be helpful. Lie or sit in any position that is comfortable, but get out of bed and walk around at least every couple of hours.  If directed, apply heat to the affected area as often as told by your health care provider. Use the heat source that your health care provider recommends, such as a moist heat pack or a heating pad. ? Place a towel between your skin and the heat source. ? Leave the heat on for 20-30 minutes. ? Remove the heat if your skin turns bright red. This is especially important if you are unable to feel pain, heat, or cold. You may have a greater risk of getting burned.  If directed, put ice on the painful area. ? Put ice in a plastic bag. ? Place a towel between your skin and the bag. ? Leave the ice on for 20 minutes, 2-3 times a day. General instructions  Your health care provider may  recommend that you see a physical therapist. This person can help you come up with a safe exercise program. Do any exercises as told by your physical therapist.  Keep all follow-up visits, including any physical therapy visits, as told by your health care providers. This is important. Contact a health care provider if:  Your pain gets worse.  Medicines do not help ease your pain.  You cannot use the part of your body that hurts, such as your arm, leg, or neck.  You have trouble sleeping.  You have trouble doing your normal activities. Get help right away if:  You have a new injury and your pain is worse or different.  You feel numb or you have tingling in the painful area. Summary  Musculoskeletal pain refers to aches and pains in your bones, joints, muscles, and the tissues that surround them.  This pain can occur in any part of the body.  Your health care provider may recommend that you see a physical therapist. This person can help you come up with a safe exercise program. Do any exercises as told by your physical therapist.  Lower your stress level. Stress can worsen musculoskeletal pain. Ways to lower stress may include meditation, yoga, cognitive or behavioral therapy, acupuncture, and massage therapy. This information is not intended to replace advice given to you by your health care provider. Make sure you discuss any questions you have with your health care provider. Document Revised: 07/29/2017 Document Reviewed: 09/15/2016 Elsevier Patient   Education  2020 [employer]  

## 2020-08-05 ENCOUNTER — Other Ambulatory Visit: Payer: Self-pay

## 2020-08-05 ENCOUNTER — Ambulatory Visit: Payer: BC Managed Care – PPO

## 2020-11-13 ENCOUNTER — Encounter: Payer: Self-pay | Admitting: Family Medicine

## 2021-02-16 ENCOUNTER — Encounter: Payer: Self-pay | Admitting: Family Medicine

## 2021-02-16 ENCOUNTER — Other Ambulatory Visit: Payer: Self-pay

## 2021-02-16 ENCOUNTER — Ambulatory Visit (INDEPENDENT_AMBULATORY_CARE_PROVIDER_SITE_OTHER): Payer: BC Managed Care – PPO | Admitting: Family Medicine

## 2021-02-16 VITALS — BP 104/58 | HR 56 | Temp 97.9°F | Ht 70.0 in | Wt 190.0 lb

## 2021-02-16 DIAGNOSIS — E291 Testicular hypofunction: Secondary | ICD-10-CM | POA: Diagnosis not present

## 2021-02-16 DIAGNOSIS — Z0000 Encounter for general adult medical examination without abnormal findings: Secondary | ICD-10-CM | POA: Diagnosis not present

## 2021-02-16 DIAGNOSIS — Z125 Encounter for screening for malignant neoplasm of prostate: Secondary | ICD-10-CM | POA: Diagnosis not present

## 2021-02-16 DIAGNOSIS — E785 Hyperlipidemia, unspecified: Secondary | ICD-10-CM

## 2021-02-16 DIAGNOSIS — Z23 Encounter for immunization: Secondary | ICD-10-CM

## 2021-02-16 LAB — POCT URINALYSIS DIPSTICK
Bilirubin, UA: NEGATIVE
Blood, UA: NEGATIVE
Glucose, UA: NEGATIVE
Ketones, UA: NEGATIVE
Leukocytes, UA: NEGATIVE
Nitrite, UA: NEGATIVE
Protein, UA: NEGATIVE
Spec Grav, UA: 1.02 (ref 1.010–1.025)
Urobilinogen, UA: 0.2 E.U./dL
pH, UA: 6.5 (ref 5.0–8.0)

## 2021-02-16 NOTE — Progress Notes (Signed)
Complete physical exam   Patient: Danny Ortiz   DOB: 1955-12-20   66 y.o. Male  MRN: [id number] Visit Date: 02/16/2021  Today's healthcare provider: Wilhemena Durie, MD   Chief Complaint  Patient presents with   Annual Exam   Subjective    Danny Ortiz is a 65 y.o. male who presents today for a complete physical exam.  He reports consuming a general diet. The patient does not participate in regular exercise at present. He generally feels well. He reports sleeping well. He does not have additional problems to discuss today.  Patient is divorced and is a father of 2.  He has had no COVID vaccines and refuses them.  He has had the shingles  vaccines.   Past Medical History:  Diagnosis Date   Balanitis    Bronchitis    Heartburn    Sleep apnea    Past Surgical History:  Procedure Laterality Date   TONSILLECTOMY AND ADENOIDECTOMY     Social History   Socioeconomic History   Marital status: Married    Spouse name: Not on file   Number of children: Not on file   Years of education: Not on file   Highest education level: Not on file  Occupational History   Not on file  Tobacco Use   Smoking status: Never   Smokeless tobacco: Never  Vaping Use   Vaping Use: Never used  Substance and Sexual Activity   Alcohol use: Yes    Alcohol/week: 0.0 standard drinks    Comment: OCCASIONALLY   Drug use: No   Sexual activity: Never  Other Topics Concern   Not on file  Social History Narrative   Not on file   Social Determinants of Health   Financial Resource Strain: Not on file  Food Insecurity: Not on file  Transportation Needs: Not on file  Physical Activity: Not on file  Stress: Not on file  Social Connections: Not on file  Intimate Partner Violence: Not on file   Family Status  Relation Name Status   Mother  Alive   Father  Alive   Brother 1 Alive   Brother 2 Alive   Brother 3 Alive   MGM  (Not Specified)   MGF  (Not Specified)   Neg Hx  (Not  Specified)   Family History  Problem Relation Age of Onset   Heart disease Mother    Lymphoma Mother    Colon polyps Maternal Grandmother    Prostate cancer Maternal Grandfather    Kidney disease Neg Hx    Allergies  Allergen Reactions   Azithromycin Other (See Comments)    Increased HR   Penicillins Nausea And Vomiting   Prednisone Other (See Comments)    Increased hr   Wellbutrin [Bupropion] Other (See Comments)    Jittery/shakes, dry mouth, felt bad    Patient Care Team: Jerrol Banana., MD as PCP - General (Family Medicine)   Medications: Outpatient Medications Prior to Visit  Medication Sig   cyclobenzaprine (FLEXERIL) 5 MG tablet Take 1 tablet (5 mg total) by mouth 3 (three) times daily as needed for muscle spasms.   omeprazole (PRILOSEC) 20 MG capsule Take 20 mg by mouth daily. prn   sildenafil (REVATIO) 20 MG tablet TAKE 1-5 TABLETS BY MOUTH DAILY AS NEEDED   tadalafil (CIALIS) 20 MG tablet Take 1 tablet (20 mg total) by mouth every three (3) days as needed for erectile dysfunction.   valACYclovir (VALTREX)  500 MG tablet Take 1 tablet (500 mg total) by mouth 2 (two) times daily.   albuterol (PROVENTIL HFA;VENTOLIN HFA) 108 (90 Base) MCG/ACT inhaler Inhale 2 puffs into the lungs every 6 (six) hours as needed for wheezing or shortness of breath. (Patient not taking: No sig reported)   azelastine (ASTELIN) 0.1 % nasal spray Place into the nose. (Patient not taking: No sig reported)   montelukast (SINGULAIR) 10 MG tablet TAKE 1 TABLET BY MOUTH EVERYDAY AT BEDTIME (Patient not taking: No sig reported)   No facility-administered medications prior to visit.    Review of Systems  All other systems reviewed and are negative.     Objective    BP (!) 104/58   Pulse (!) 56   Temp 97.9 F (36.6 C)   Ht 5\' 10"  (1.778 m)   Wt 190 lb (86.2 kg)   BMI 27.26 kg/m  BP Readings from Last 3 Encounters:  02/16/21 (!) 104/58  07/23/20 113/83  04/11/20 102/60   Wt  Readings from Last 3 Encounters:  02/16/21 190 lb (86.2 kg)  07/23/20 190 lb 1.6 oz (86.2 kg)  04/11/20 184 lb 3.2 oz (83.6 kg)      Physical Exam Vitals reviewed.  Constitutional:      Appearance: He is well-developed.  HENT:     Head: Normocephalic and atraumatic.     Right Ear: External ear normal.     Left Ear: External ear normal.     Nose: Nose normal.  Eyes:     Conjunctiva/sclera: Conjunctivae normal.     Pupils: Pupils are equal, round, and reactive to light.  Cardiovascular:     Rate and Rhythm: Normal rate and regular rhythm.     Heart sounds: Normal heart sounds.  Pulmonary:     Effort: Pulmonary effort is normal.     Breath sounds: Normal breath sounds.  Abdominal:     General: Bowel sounds are normal.     Palpations: Abdomen is soft.  Genitourinary:    Penis: Normal.      Prostate: Normal.     Rectum: Normal.  Musculoskeletal:        General: Normal range of motion.     Cervical back: Normal range of motion and neck supple.  Skin:    General: Skin is warm and dry.  Neurological:     Mental Status: He is alert and oriented to person, place, and time.  Psychiatric:        Behavior: Behavior normal.        Thought Content: Thought content normal.        Judgment: Judgment normal.      Last depression screening scores PHQ 2/9 Scores 07/23/2020 11/12/2019 01/12/2018  PHQ - 2 Score 3 0 0  PHQ- 9 Score 3 0 0   Last fall risk screening Fall Risk  07/23/2020  Falls in the past year? 0  Number falls in past yr: 0  Injury with Fall? 0  Risk for fall due to : No Fall Risks  Follow up Falls evaluation completed   Last Audit-C alcohol use screening Alcohol Use Disorder Test (AUDIT) 07/23/2020  1. How often do you have a drink containing alcohol? 2  2. How many drinks containing alcohol do you have on a typical day when you are drinking? 0  3. How often do you have six or more drinks on one occasion? 0  AUDIT-C Score 2   A score of 3 or more in women,  and  4 or more in men indicates increased risk for alcohol abuse, EXCEPT if all of the points are from question 1   No results found for any visits on 02/16/21.  Assessment & Plan    Routine Health Maintenance and Physical Exam  Exercise Activities and Dietary recommendations  Goals   None     Immunization History  Administered Date(s) Administered   Influenza Whole 06/14/2017   Td 09/11/2004   Tdap 10/10/2014, 06/14/2017    Health Maintenance  Topic Date Due   COVID-19 Vaccine (1) Never done   Zoster Vaccines- Shingrix (1 of 2) Never done   PNA vac Low Risk Adult (1 of 2 - PCV13) Never done   INFLUENZA VACCINE  03/30/2021   COLONOSCOPY (Pts 45-61yrs Insurance coverage will need to be confirmed)  02/15/2022   TETANUS/TDAP  06/15/2027   Hepatitis C Screening  Completed   HPV VACCINES  Aged Out    Discussed health benefits of physical activity, and encouraged him to engage in regular exercise appropriate for his age and condition.  1. Annual physical exam Patient requests coronary CT for calcium score.  I think this is reasonable so we will order. - POCT urinalysis dipstick - CT CARDIAC SCORING (SELF PAY ONLY); Future  2. Hypogonadism in male He has had low testosterone and elevated prolactin.  Needs an MRI or urology consult or endocrine consult if this continues to be the trend - Testosterone - Prolactin  3. Hyperlipidemia, unspecified hyperlipidemia type  - CBC with Differential/Platelet - Comprehensive metabolic panel - Lipid panel - TSH  4. Prostate cancer screening  - PSA  5. Need for pneumococcal vaccination Again, recommended COVID-vaccine and patient refuses.  He is willing to get the pneumonia shot - Pneumococcal conjugate vaccine 20-valent (Prevnar 20)   No follow-ups on file.     I, Wilhemena Durie, MD, have reviewed all documentation for this visit. The documentation on 02/21/21 for the exam, diagnosis, procedures, and orders are all  accurate and complete.    Elisabet Gutzmer Cranford Mon, MD  [facility] [phone] (phone) [phone] (fax)  [place]

## 2021-02-17 LAB — COMPREHENSIVE METABOLIC PANEL
ALT: 14 IU/L (ref 0–44)
AST: 17 IU/L (ref 0–40)
Albumin/Globulin Ratio: 2.5 — ABNORMAL HIGH (ref 1.2–2.2)
Albumin: 4.9 g/dL — ABNORMAL HIGH (ref 3.8–4.8)
Alkaline Phosphatase: 70 IU/L (ref 44–121)
BUN/Creatinine Ratio: 14 (ref 10–24)
BUN: 15 mg/dL (ref 8–27)
Bilirubin Total: 0.6 mg/dL (ref 0.0–1.2)
CO2: 25 mmol/L (ref 20–29)
Calcium: 9.6 mg/dL (ref 8.6–10.2)
Chloride: 99 mmol/L (ref 96–106)
Creatinine, Ser: 1.05 mg/dL (ref 0.76–1.27)
Globulin, Total: 2 g/dL (ref 1.5–4.5)
Glucose: 86 mg/dL (ref 65–99)
Potassium: 4.6 mmol/L (ref 3.5–5.2)
Sodium: 137 mmol/L (ref 134–144)
Total Protein: 6.9 g/dL (ref 6.0–8.5)
eGFR: 78 mL/min/{1.73_m2} (ref >59)

## 2021-02-17 LAB — CBC WITH DIFFERENTIAL/PLATELET
Basophils Absolute: 0.1 10*3/uL (ref 0.0–0.2)
Basos: 1 %
EOS (ABSOLUTE): 0.4 10*3/uL (ref 0.0–0.4)
Eos: 5 %
Hematocrit: 45.4 % (ref 37.5–51.0)
Hemoglobin: 15.8 g/dL (ref 13.0–17.7)
Immature Grans (Abs): 0 10*3/uL (ref 0.0–0.1)
Immature Granulocytes: 0 %
Lymphocytes Absolute: 1.6 10*3/uL (ref 0.7–3.1)
Lymphs: 20 %
MCH: 31 pg (ref 26.6–33.0)
MCHC: 34.8 g/dL (ref 31.5–35.7)
MCV: 89 fL (ref 79–97)
Monocytes Absolute: 0.8 10*3/uL (ref 0.1–0.9)
Monocytes: 9 %
Neutrophils Absolute: 5.3 10*3/uL (ref 1.4–7.0)
Neutrophils: 65 %
Platelets: 225 10*3/uL (ref 150–450)
RBC: 5.09 x10E6/uL (ref 4.14–5.80)
RDW: 11.7 % (ref 11.6–15.4)
WBC: 8.2 10*3/uL (ref 3.4–10.8)

## 2021-02-17 LAB — TESTOSTERONE: Testosterone: 235 ng/dL — ABNORMAL LOW (ref 264–916)

## 2021-02-17 LAB — TSH: TSH: 2.3 u[IU]/mL (ref 0.450–4.500)

## 2021-02-17 LAB — LIPID PANEL
Chol/HDL Ratio: 7 ratio — ABNORMAL HIGH (ref 0.0–5.0)
Cholesterol, Total: 217 mg/dL — ABNORMAL HIGH (ref 100–199)
HDL: 31 mg/dL — ABNORMAL LOW (ref >39)
LDL Chol Calc (NIH): 147 mg/dL — ABNORMAL HIGH (ref 0–99)
Triglycerides: 213 mg/dL — ABNORMAL HIGH (ref 0–149)
VLDL Cholesterol Cal: 39 mg/dL (ref 5–40)

## 2021-02-17 LAB — PSA: Prostate Specific Ag, Serum: 1.8 ng/mL (ref 0.0–4.0)

## 2021-02-17 LAB — PROLACTIN: Prolactin: 32 ng/mL — ABNORMAL HIGH (ref 4.0–15.2)

## 2021-02-23 ENCOUNTER — Telehealth: Payer: Self-pay | Admitting: Family Medicine

## 2021-02-23 NOTE — Telephone Encounter (Signed)
Patient inquiring about 02/16/2021 lab results, please advise

## 2021-02-25 ENCOUNTER — Other Ambulatory Visit: Payer: Self-pay

## 2021-02-25 ENCOUNTER — Ambulatory Visit
Admission: RE | Admit: 2021-02-25 | Discharge: 2021-02-25 | Disposition: A | Discharge status: Home / Self Care | Payer: BC Managed Care – PPO | Source: Ambulatory Visit | Attending: Family Medicine | Admitting: Family Medicine

## 2021-02-25 ENCOUNTER — Other Ambulatory Visit: Payer: Self-pay | Admitting: *Deleted

## 2021-02-25 DIAGNOSIS — Z0000 Encounter for general adult medical examination without abnormal findings: Secondary | ICD-10-CM | POA: Insufficient documentation

## 2021-02-25 DIAGNOSIS — R7989 Other specified abnormal findings of blood chemistry: Secondary | ICD-10-CM

## 2021-02-25 DIAGNOSIS — N529 Male erectile dysfunction, unspecified: Secondary | ICD-10-CM

## 2021-02-25 DIAGNOSIS — E291 Testicular hypofunction: Secondary | ICD-10-CM

## 2021-02-25 NOTE — Telephone Encounter (Signed)
Patient was advised of lab results. Patient is agreeable to referral to Urology. Referral ordered.

## 2021-03-06 ENCOUNTER — Other Ambulatory Visit: Payer: Self-pay

## 2021-03-06 ENCOUNTER — Encounter: Payer: Self-pay | Admitting: Urology

## 2021-03-06 ENCOUNTER — Ambulatory Visit: Payer: Medicare HMO | Admitting: Urology

## 2021-03-06 VITALS — BP 115/67 | HR 66 | Ht 70.0 in | Wt 186.0 lb

## 2021-03-06 DIAGNOSIS — R7989 Other specified abnormal findings of blood chemistry: Secondary | ICD-10-CM

## 2021-03-06 DIAGNOSIS — E221 Hyperprolactinemia: Secondary | ICD-10-CM | POA: Diagnosis not present

## 2021-03-06 DIAGNOSIS — E291 Testicular hypofunction: Secondary | ICD-10-CM | POA: Diagnosis not present

## 2021-03-06 DIAGNOSIS — N5201 Erectile dysfunction due to arterial insufficiency: Secondary | ICD-10-CM

## 2021-03-06 NOTE — Progress Notes (Signed)
03/06/2021 9:06 AM   Danny Ortiz 10-18-1955 [id number]  Referring provider: Jerrol Banana., MD [address] [place],  [place] [address]  Chief Complaint  Patient presents with   Hypogonadism    HPI: Danny Ortiz is a 65 y.o. male referred for evaluation of hypogonadism.  Seen by endocrinology at [facility] 2017 and 2018 for hyperprolactinemia and hypogonadism Pituitary MRI 2013 was negative Had tried AndroGel in 2013 for a brief period but elected not to pursue TRT  Labs 02/16/2021 remarkable for a prolactin of 32 ng/mL and testosterone of 235 ng/dL.  PSA was stable at 1.8 He has recently had increase symptoms of tiredness, fatigue Has been on PDE 5 inhibitors for ED for several years but states they are taking longer to work  PMH: Past Medical History:  Diagnosis Date   Balanitis    Bronchitis    Heartburn    Sleep apnea     Surgical History: Past Surgical History:  Procedure Laterality Date   TONSILLECTOMY AND ADENOIDECTOMY      Home Medications:  Allergies as of 03/06/2021       Reactions   Azithromycin Other (See Comments)   Increased HR   Penicillins Nausea And Vomiting   Prednisone Other (See Comments)   Increased hr   Wellbutrin [bupropion] Other (See Comments)   Jittery/shakes, dry mouth, felt bad        Medication List        Accurate as of March 06, 2021 11:59 PM. If you have any questions, ask your nurse or doctor.          STOP taking these medications    albuterol 108 (90 Base) MCG/ACT inhaler Commonly known as: VENTOLIN HFA Stopped by: Abbie Sons, MD   azelastine 0.1 % nasal spray Commonly known as: ASTELIN Stopped by: Abbie Sons, MD   cyclobenzaprine 5 MG tablet Commonly known as: FLEXERIL Stopped by: Abbie Sons, MD   montelukast 10 MG tablet Commonly known as: SINGULAIR Stopped by: Abbie Sons, MD       TAKE these medications    omeprazole 20 MG capsule Commonly known as:  PRILOSEC Take 20 mg by mouth daily. prn   sildenafil 20 MG tablet Commonly known as: REVATIO TAKE 1-5 TABLETS BY MOUTH DAILY AS NEEDED   tadalafil 20 MG tablet Commonly known as: Cialis Take 1 tablet (20 mg total) by mouth every three (3) days as needed for erectile dysfunction.   valACYclovir 500 MG tablet Commonly known as: VALTREX Take 1 tablet (500 mg total) by mouth 2 (two) times daily.        Allergies:  Allergies  Allergen Reactions   Azithromycin Other (See Comments)    Increased HR   Penicillins Nausea And Vomiting   Prednisone Other (See Comments)    Increased hr   Wellbutrin [Bupropion] Other (See Comments)    Jittery/shakes, dry mouth, felt bad    Family History: Family History  Problem Relation Age of Onset   Heart disease Mother    Lymphoma Mother    Colon polyps Maternal Grandmother    Prostate cancer Maternal Grandfather    Kidney disease Neg Hx     Social History:  reports that he has never smoked. He has never used smokeless tobacco. He reports current alcohol use. He reports that he does not use drugs.   Physical Exam: BP 115/67 (BP Location: Left Arm, Patient Position: Sitting, Cuff Size: Normal)   Pulse  66   Ht 5\' 10"  (1.778 m)   Wt 186 lb (84.4 kg)   BMI 26.69 kg/m   Constitutional:  Alert and oriented, No acute distress. HEENT: [place] AT, moist mucus membranes.  Trachea midline, no masses. Cardiovascular: No clubbing, cyanosis, or edema. Respiratory: Normal respiratory effort, no increased work of breathing. Neurologic: Grossly intact, no focal deficits, moving all 4 extremities. Psychiatric: Normal mood and affect.   Assessment & Plan:    1.  Hypogonadism Symptomatic hypogonadism Potential side effects of testosterone replacement were discussed including stimulation of benign prostatic growth with lower urinary tract symptoms; erythrocytosis; edema; gynecomastia; worsening sleep apnea; venous thromboembolism; testicular atrophy and  infertility. Recent studies suggesting an increased incidence of heart attack and stroke in patients taking testosterone was discussed. He was informed there is conflicting evidence regarding the impact of testosterone therapy on cardiovascular risk. The theoretical risk of growth stimulation of an undetected prostate cancer was also discussed.  He was informed that current evidence does not provide any definitive answers regarding the risks of testosterone therapy on prostate cancer and cardiovascular disease. The need for periodic monitoring of his testosterone level, PSA, hematocrit and DRE was discussed.  2.  Hyperprolactinemia He has not seen endocrinology since 2018 and recommended endocrinology follow-up   3.  Erectile dysfunction Worsening   Abbie Sons, MD  [facility] [address] [place], [place] [address] [phone]

## 2021-03-08 ENCOUNTER — Encounter: Payer: Self-pay | Admitting: Urology

## 2021-03-10 DIAGNOSIS — H43813 Vitreous degeneration, bilateral: Secondary | ICD-10-CM | POA: Diagnosis not present

## 2021-03-11 ENCOUNTER — Telehealth: Payer: Self-pay

## 2021-03-11 NOTE — Telephone Encounter (Signed)
Copied from [place] [phone]. Topic: General - Other >> Mar 10, 2021  4:22 PM Bayard Beaver wrote: Reason for CRM: patient called in about getting referral for endocronologist. He says discussed with Dr Rosanna Randy at his last appt. Please call back

## 2021-03-11 NOTE — Telephone Encounter (Signed)
Returned call to patient. No answer and no vm. Referral was ordered on 03/08/2021. Patient will be contacted by Endocrinology office to schedule an appt. Okay for PEC to advise patient.

## 2021-06-04 DIAGNOSIS — E291 Testicular hypofunction: Secondary | ICD-10-CM | POA: Diagnosis not present

## 2021-06-04 DIAGNOSIS — E221 Hyperprolactinemia: Secondary | ICD-10-CM | POA: Diagnosis not present

## 2021-06-05 DIAGNOSIS — E291 Testicular hypofunction: Secondary | ICD-10-CM | POA: Diagnosis not present

## 2021-06-05 DIAGNOSIS — E221 Hyperprolactinemia: Secondary | ICD-10-CM | POA: Diagnosis not present

## 2021-06-09 ENCOUNTER — Other Ambulatory Visit: Payer: Self-pay | Admitting: "Endocrinology

## 2021-06-09 DIAGNOSIS — E291 Testicular hypofunction: Secondary | ICD-10-CM

## 2021-06-09 DIAGNOSIS — E221 Hyperprolactinemia: Secondary | ICD-10-CM

## 2021-06-30 ENCOUNTER — Other Ambulatory Visit: Payer: Self-pay

## 2021-06-30 ENCOUNTER — Ambulatory Visit
Admission: RE | Admit: 2021-06-30 | Discharge: 2021-06-30 | Disposition: A | Discharge status: Home / Self Care | Payer: Medicare HMO | Source: Ambulatory Visit | Attending: "Endocrinology | Admitting: "Endocrinology

## 2021-06-30 DIAGNOSIS — E221 Hyperprolactinemia: Secondary | ICD-10-CM | POA: Diagnosis not present

## 2021-06-30 DIAGNOSIS — E291 Testicular hypofunction: Secondary | ICD-10-CM | POA: Diagnosis not present

## 2021-06-30 MED ORDER — GADOBUTROL 1 MMOL/ML IV SOLN
8.0000 mL | Freq: Once | INTRAVENOUS | Status: AC | PRN
  Administered 2021-06-30: 8 mL via INTRAVENOUS

## 2021-10-12 ENCOUNTER — Telehealth: Payer: Self-pay

## 2021-10-12 NOTE — Telephone Encounter (Signed)
Paitent has been advised.K

## 2021-10-12 NOTE — Telephone Encounter (Signed)
Copied from [place] [phone]. Topic: General - Other >> Oct 12, 2021  1:29 PM Pawlus, Brayton Layman A wrote: Reason for CRM: Pt wanted to know which dermatologists Dr Rosanna Randy would recommend, please advise.

## 2021-10-15 ENCOUNTER — Ambulatory Visit (INDEPENDENT_AMBULATORY_CARE_PROVIDER_SITE_OTHER): Payer: Medicare HMO | Admitting: Family Medicine

## 2021-10-15 ENCOUNTER — Other Ambulatory Visit: Payer: Self-pay

## 2021-10-15 ENCOUNTER — Encounter: Payer: Self-pay | Admitting: Family Medicine

## 2021-10-15 VITALS — BP 92/74 | HR 64 | Temp 98.4°F | Wt 195.0 lb

## 2021-10-15 DIAGNOSIS — L989 Disorder of the skin and subcutaneous tissue, unspecified: Secondary | ICD-10-CM | POA: Insufficient documentation

## 2021-10-15 DIAGNOSIS — R202 Paresthesia of skin: Secondary | ICD-10-CM | POA: Insufficient documentation

## 2021-10-15 DIAGNOSIS — R252 Cramp and spasm: Secondary | ICD-10-CM

## 2021-10-15 DIAGNOSIS — M25471 Effusion, right ankle: Secondary | ICD-10-CM

## 2021-10-15 DIAGNOSIS — M25472 Effusion, left ankle: Secondary | ICD-10-CM | POA: Insufficient documentation

## 2021-10-15 NOTE — Assessment & Plan Note (Signed)
Sock indentation seen on exam Refer for ABI

## 2021-10-15 NOTE — Assessment & Plan Note (Addendum)
Intermittent, with no other s/s At site of axilla, when arm is straightened Likely pinched nerve(s)- reassurance provided Discussed cardiac related chest pain and need for treatment if symptoms change/evolve Referral to vascular

## 2021-10-15 NOTE — Patient Instructions (Addendum)
The 10-year ASCVD risk score (Arnett DK, et al., 2019) is: 11.5%   Values used to calculate the score:     Age: 65 years     Sex: Male     Is Non-Hispanic African American: No     Diabetic: No     Tobacco smoker: No     Systolic Blood Pressure: 92 mmHg     Is BP treated: No     HDL Cholesterol: 31 mg/dL     Total Cholesterol: 217 mg/dL  Goal of 7.5% or lower. ASCVD- google, input recent labs, and vitals to get most up to date.   To assist with portions:  9" plate - work to 01% vegetables, 25% proteins/some days- opt plant proteins, 25% grains  To prevent diabetes:  Continue to recommend balanced, lower carb meals. Smaller meal size, adding snacks. Choosing water as drink of choice and increasing purposeful exercise.  To raise your good cholesterol:  Recommend increase in diet of healthier fat choices- low fat meats, oils that are not solid at room temperature, nuts, seeds, fish- cod, halibut, salmon, and avocado. Exercise can also increase this number.  Supplemental omega 3's can be taken as well but are not as helpful as dietary/exercise changes.

## 2021-10-15 NOTE — Assessment & Plan Note (Signed)
3 mm acrochordon, pt report increased size from report of wife Pt report associated itching Derm referral

## 2021-10-15 NOTE — Progress Notes (Signed)
Acute Office Visit  Subjective:    Patient ID: Danny Ortiz, male    DOB: March 05, 1956, 66 y.o.   MRN: [id number]  No chief complaint on file.   HPI Patient is in today for evaluation of cramps that come in the back his knees.  He finds that cramps are mainly at night.  Patient states that this has been going on for about 3-4 weeks.  He thinks it might be from not drinking enough but wanted to make sure something else wasn't going on.  Past Medical History:  Diagnosis Date   Balanitis    Bronchitis    Heartburn    Sleep apnea     Past Surgical History:  Procedure Laterality Date   TONSILLECTOMY AND ADENOIDECTOMY      Family History  Problem Relation Age of Onset   Heart disease Mother    Lymphoma Mother    Colon polyps Maternal Grandmother    Prostate cancer Maternal Grandfather    Kidney disease Neg Hx     Social History   Socioeconomic History   Marital status: Married    Spouse name: Not on file   Number of children: Not on file   Years of education: Not on file   Highest education level: Not on file  Occupational History   Not on file  Tobacco Use   Smoking status: Never   Smokeless tobacco: Never  Vaping Use   Vaping Use: Never used  Substance and Sexual Activity   Alcohol use: Yes    Alcohol/week: 0.0 standard drinks    Comment: OCCASIONALLY   Drug use: No   Sexual activity: Never  Other Topics Concern   Not on file  Social History Narrative   Not on file   Social Determinants of Health   Financial Resource Strain: Not on file  Food Insecurity: Not on file  Transportation Needs: Not on file  Physical Activity: Not on file  Stress: Not on file  Social Connections: Not on file  Intimate Partner Violence: Not on file    Outpatient Medications Prior to Visit  Medication Sig Dispense Refill   omeprazole (PRILOSEC) 20 MG capsule Take 20 mg by mouth daily. prn     sildenafil (REVATIO) 20 MG tablet TAKE 1-5 TABLETS BY MOUTH DAILY AS NEEDED 50  tablet 0   tadalafil (CIALIS) 20 MG tablet Take 1 tablet (20 mg total) by mouth every three (3) days as needed for erectile dysfunction. 30 tablet 3   valACYclovir (VALTREX) 500 MG tablet Take 1 tablet (500 mg total) by mouth 2 (two) times daily. 180 tablet 3   No facility-administered medications prior to visit.    Allergies  Allergen Reactions   Azithromycin Other (See Comments)    Increased HR   Penicillins Nausea And Vomiting   Prednisone Other (See Comments)    Increased hr   Wellbutrin [Bupropion] Other (See Comments)    Jittery/shakes, dry mouth, felt bad    Review of Systems  Musculoskeletal:  Positive for arthralgias (just knees). Negative for back pain, gait problem, joint swelling, myalgias, neck pain and neck stiffness.      Objective:    Physical Exam Vitals and nursing note reviewed.  Constitutional:      Appearance: Normal appearance. He is well-developed, well-groomed and overweight.  HENT:     Head: Normocephalic and atraumatic.  Eyes:     Pupils: Pupils are equal, round, and reactive to light.  Cardiovascular:     Rate and  Rhythm: Normal rate and regular rhythm.     Pulses: Normal pulses.     Heart sounds: Normal heart sounds.     Comments: Sock indentation noted on exam Non pitting edema Pulmonary:     Effort: Pulmonary effort is normal.     Breath sounds: Normal breath sounds.  Musculoskeletal:        General: Normal range of motion.     Cervical back: Normal range of motion.     Right lower leg: Edema present.     Left lower leg: Edema present.     Comments: Sock indentation noted; non pitting to remainder of BLE  Skin:    General: Skin is warm and dry.     Capillary Refill: Capillary refill takes less than 2 seconds.          Comments: Small, benign appearing, acrochordon roughly 3 mm. Pt reported increasing size with associated itching.  Neurological:     General: No focal deficit present.     Mental Status: He is alert and oriented to  person, place, and time. Mental status is at baseline.  Psychiatric:        Mood and Affect: Mood normal.        Behavior: Behavior normal.        Thought Content: Thought content normal.        Judgment: Judgment normal.    BP 92/74 (BP Location: Right Arm, Patient Position: Sitting, Cuff Size: Normal)    Pulse 64    Temp 98.4 F (36.9 C) (Oral)    Wt 195 lb (88.5 kg)    SpO2 95%    BMI 27.98 kg/m  Wt Readings from Last 3 Encounters:  10/15/21 195 lb (88.5 kg)  03/06/21 186 lb (84.4 kg)  02/16/21 190 lb (86.2 kg)    Health Maintenance Due  Topic Date Due   COVID-19 Vaccine (1) Never done   Zoster Vaccines- Shingrix (2 of 2) 08/09/2018    There are no preventive care reminders to display for this patient.   Lab Results  Component Value Date   TSH 2.300 02/16/2021   Lab Results  Component Value Date   WBC 8.2 02/16/2021   HGB 15.8 02/16/2021   HCT 45.4 02/16/2021   MCV 89 02/16/2021   PLT 225 02/16/2021   Lab Results  Component Value Date   NA 137 02/16/2021   K 4.6 02/16/2021   CO2 25 02/16/2021   GLUCOSE 86 02/16/2021   BUN 15 02/16/2021   CREATININE 1.05 02/16/2021   BILITOT 0.6 02/16/2021   ALKPHOS 70 02/16/2021   AST 17 02/16/2021   ALT 14 02/16/2021   PROT 6.9 02/16/2021   ALBUMIN 4.9 (H) 02/16/2021   CALCIUM 9.6 02/16/2021   EGFR 78 02/16/2021   Lab Results  Component Value Date   CHOL 217 (H) 02/16/2021   Lab Results  Component Value Date   HDL 31 (L) 02/16/2021   Lab Results  Component Value Date   LDLCALC 147 (H) 02/16/2021   Lab Results  Component Value Date   TRIG 213 (H) 02/16/2021   Lab Results  Component Value Date   CHOLHDL 7.0 (H) 02/16/2021   No results found for: HGBA1C     Assessment & Plan:   Problem List Items Addressed This Visit       Musculoskeletal and Integument   Benign skin growth    3 mm acrochordon, pt report increased size from report of wife Pt report associated itching Derm  referral       Relevant Orders   Ambulatory referral to Dermatology     Other   Leg cramps - Primary    Gather CMP and Mg Encourage additional water intake Encourage potassium rich foods Encourage regular exercise Encourage use of compression sockings with travel      Relevant Orders   Comprehensive metabolic panel   Magnesium   Ambulatory referral to Vascular Surgery   Swollen ankles    Sock indentation seen on exam Refer for ABI       Relevant Orders   Ambulatory referral to Vascular Surgery   Tingling of left upper extremity    Intermittent, with no other s/s At site of axilla, when arm is straightened Likely pinched nerve(s)- reassurance provided Discussed cardiac related chest pain and need for treatment if symptoms change/evolve Referral to vascular       Relevant Orders   Ambulatory referral to Vascular Surgery     No orders of the defined types were placed in this encounter.    Argentina Ponder DeSanto,acting as a scribe for Gwyneth Sprout, FNP.,have documented all relevant documentation on the behalf of Gwyneth Sprout, FNP,as directed by  Gwyneth Sprout, FNP while in the presence of Gwyneth Sprout, FNP.  Vonna Kotyk, FNP, have reviewed all documentation for this visit. The documentation on 10/15/21 for the exam, diagnosis, procedures, and orders are all accurate and complete.

## 2021-10-15 NOTE — Assessment & Plan Note (Signed)
Gather CMP and Mg Encourage additional water intake Encourage potassium rich foods Encourage regular exercise Encourage use of compression sockings with travel

## 2021-10-20 DIAGNOSIS — R252 Cramp and spasm: Secondary | ICD-10-CM | POA: Diagnosis not present

## 2021-10-21 LAB — COMPREHENSIVE METABOLIC PANEL
ALT: 12 IU/L (ref 0–44)
AST: 13 IU/L (ref 0–40)
Albumin/Globulin Ratio: 2.5 — ABNORMAL HIGH (ref 1.2–2.2)
Albumin: 4.7 g/dL (ref 3.8–4.8)
Alkaline Phosphatase: 73 IU/L (ref 44–121)
BUN/Creatinine Ratio: 17 (ref 10–24)
BUN: 20 mg/dL (ref 8–27)
Bilirubin Total: 0.4 mg/dL (ref 0.0–1.2)
CO2: 23 mmol/L (ref 20–29)
Calcium: 9.4 mg/dL (ref 8.6–10.2)
Chloride: 103 mmol/L (ref 96–106)
Creatinine, Ser: 1.19 mg/dL (ref 0.76–1.27)
Globulin, Total: 1.9 g/dL (ref 1.5–4.5)
Glucose: 87 mg/dL (ref 70–99)
Potassium: 4.7 mmol/L (ref 3.5–5.2)
Sodium: 144 mmol/L (ref 134–144)
Total Protein: 6.6 g/dL (ref 6.0–8.5)
eGFR: 67 mL/min/{1.73_m2} (ref >59)

## 2021-10-21 LAB — MAGNESIUM: Magnesium: 2.4 mg/dL — ABNORMAL HIGH (ref 1.6–2.3)

## 2021-11-03 ENCOUNTER — Telehealth: Payer: Self-pay | Admitting: Family Medicine

## 2021-11-03 NOTE — Telephone Encounter (Signed)
Pt calling says appt with previous dermatologist referral is not until August, so he refused that on. He is looking for something sooner if possible. ?

## 2021-11-19 ENCOUNTER — Other Ambulatory Visit: Payer: Self-pay

## 2021-11-19 ENCOUNTER — Encounter (INDEPENDENT_AMBULATORY_CARE_PROVIDER_SITE_OTHER): Payer: Self-pay | Admitting: Nurse Practitioner

## 2021-11-19 ENCOUNTER — Ambulatory Visit (INDEPENDENT_AMBULATORY_CARE_PROVIDER_SITE_OTHER): Payer: Self-pay | Admitting: Nurse Practitioner

## 2021-11-19 VITALS — BP 114/73 | HR 69 | Resp 16 | Ht 70.0 in | Wt 193.8 lb

## 2021-11-19 DIAGNOSIS — E785 Hyperlipidemia, unspecified: Secondary | ICD-10-CM

## 2021-11-19 DIAGNOSIS — R202 Paresthesia of skin: Secondary | ICD-10-CM

## 2021-11-19 DIAGNOSIS — R252 Cramp and spasm: Secondary | ICD-10-CM

## 2021-11-23 ENCOUNTER — Encounter (INDEPENDENT_AMBULATORY_CARE_PROVIDER_SITE_OTHER): Payer: Self-pay | Admitting: Nurse Practitioner

## 2021-11-23 NOTE — Progress Notes (Signed)
? ?Subjective:  ? ? Patient ID: Danny Ortiz, male    DOB: [birth year], 66 y.o.   MRN: [id number] ?Chief Complaint  ?Patient presents with  ? Establish Care  ?  Referred by dr payne  ? ? ?Danny Ortiz is a 66 year old male that presents today as a referral from Ms. Danny Rotunda, FNP in regards to lower extremity leg cramps as well as lower extremity edema.  These leg cramps are not predictable such as always occurring at night or recurring with ambulation.  He also notes that getting to the day there is some indentation noted on his socks.  He denies classic claudication-like symptoms or rest pain.  Previously prior to his visit there was also some numbness and tingling near the axillary of his left upper extremity that has not happened in several weeks. ? ? ?Review of Systems  ?Cardiovascular:  Positive for leg swelling.  ?All other systems reviewed and are negative. ? ?   ?Objective:  ? Physical Exam ?Vitals reviewed.  ?HENT:  ?   Head: Normocephalic.  ?Cardiovascular:  ?   Rate and Rhythm: Normal rate.  ?   Pulses: Normal pulses.  ?Pulmonary:  ?   Effort: Pulmonary effort is normal.  ?Skin: ?   General: Skin is warm and dry.  ?   Capillary Refill: Capillary refill takes less than 2 seconds.  ?Neurological:  ?   Mental Status: He is alert and oriented to person, place, and time.  ?Psychiatric:     ?   Mood and Affect: Mood normal.     ?   Behavior: Behavior normal.     ?   Thought Content: Thought content normal.     ?   Judgment: Judgment normal.  ? ? ?BP 114/73 (BP Location: Right Arm)   Pulse 69   Resp 16   Ht '5\' 10"'$  (1.778 m)   Wt 193 lb 12.8 oz (87.9 kg)   BMI 27.81 kg/m?  ? ?Past Medical History:  ?Diagnosis Date  ? Balanitis   ? Bronchitis   ? Heartburn   ? Sleep apnea   ? ? ?Social History  ? ?Socioeconomic History  ? Marital status: Married  ?  Spouse name: Not on file  ? Number of children: Not on file  ? Years of education: Not on file  ? Highest education level: Not on file  ?Occupational History  ?  Not on file  ?Tobacco Use  ? Smoking status: Never  ? Smokeless tobacco: Never  ?Vaping Use  ? Vaping Use: Never used  ?Substance and Sexual Activity  ? Alcohol use: Yes  ?  Alcohol/week: 0.0 standard drinks  ?  Comment: OCCASIONALLY  ? Drug use: No  ? Sexual activity: Never  ?Other Topics Concern  ? Not on file  ?Social History Narrative  ? Not on file  ? ?Social Determinants of Health  ? ?Financial Resource Strain: Not on file  ?Food Insecurity: Not on file  ?Transportation Needs: Not on file  ?Physical Activity: Not on file  ?Stress: Not on file  ?Social Connections: Not on file  ?Intimate Partner Violence: Not on file  ? ? ?Past Surgical History:  ?Procedure Laterality Date  ? TONSILLECTOMY AND ADENOIDECTOMY    ? ? ?Family History  ?Problem Relation Age of Onset  ? Heart disease Mother   ? Lymphoma Mother   ? Colon polyps Maternal Grandmother   ? Prostate cancer Maternal Grandfather   ? Kidney disease Neg Hx   ? ? ?  Allergies  ?Allergen Reactions  ? Azithromycin Other (See Comments)  ?  Increased HR  ? Penicillins Nausea And Vomiting  ? Prednisone Other (See Comments)  ?  Increased hr  ? Wellbutrin [Bupropion] Other (See Comments)  ?  Jittery/shakes, dry mouth, felt bad  ? ? ? ?  Latest Ref Rng & Units 02/16/2021  ? 11:05 AM 11/14/2019  ?  8:08 AM  ?CBC  ?WBC 3.4 - 10.8 x10E3/uL 8.2   6.6    ?Hemoglobin 13.0 - 17.7 g/dL 15.8   15.4    ?Hematocrit 37.5 - 51.0 % 45.4   44.3    ?Platelets 150 - 450 x10E3/uL 225   225    ? ? ? ? ?CMP  ?   ?Component Value Date/Time  ? NA 144 10/20/2021 1552  ? K 4.7 10/20/2021 1552  ? CL 103 10/20/2021 1552  ? CO2 23 10/20/2021 1552  ? GLUCOSE 87 10/20/2021 1552  ? GLUCOSE 102 (H) 07/14/2011 1022  ? BUN 20 10/20/2021 1552  ? CREATININE 1.19 10/20/2021 1552  ? CALCIUM 9.4 10/20/2021 1552  ? PROT 6.6 10/20/2021 1552  ? ALBUMIN 4.7 10/20/2021 1552  ? AST 13 10/20/2021 1552  ? ALT 12 10/20/2021 1552  ? ALKPHOS 73 10/20/2021 1552  ? BILITOT 0.4 10/20/2021 1552  ? GFRNONAA 79 11/14/2019 0808   ? GFRAA 91 11/14/2019 0808  ? ? ? ?No results found. ? ?   ?Assessment & Plan:  ? ?1. Leg cramps ?Patient does have some swelling of the lower extremities in addition to lower extremity leg cramps.  We discussed the pathophysiology of vascular disease including arterial disease versus venous disease.  Patient has some mixed components of both possibly.  We will perform both ABIs and venous reflux studies to evaluate for possible causes of leg cramps and/or edema.  Patient is advised to utilize medical grade compression stockings, elevation and activity for the edema. ? ?2. Tingling of left upper extremity ?Currently the tingling he had is no longer present.  Based upon his description of symptoms as well as his strong palpable radial pulses I feel that this is not related to arterial disease.  This is likely neurological in nature.  Patient will discuss with PCP should this return. ? ?3. Hyperlipidemia, unspecified hyperlipidemia type ?Continue statin as ordered and reviewed, no changes at this time  ? ? ?Current Outpatient Medications on File Prior to Visit  ?Medication Sig Dispense Refill  ? omeprazole (PRILOSEC) 20 MG capsule Take 20 mg by mouth daily. prn    ? sildenafil (REVATIO) 20 MG tablet TAKE 1-5 TABLETS BY MOUTH DAILY AS NEEDED 50 tablet 0  ? tadalafil (CIALIS) 20 MG tablet Take 1 tablet (20 mg total) by mouth every three (3) days as needed for erectile dysfunction. 30 tablet 3  ? valACYclovir (VALTREX) 500 MG tablet Take 1 tablet (500 mg total) by mouth 2 (two) times daily. 180 tablet 3  ? ?No current facility-administered medications on file prior to visit.  ? ? ?There are no Patient Instructions on file for this visit. ?No follow-ups on file. ? ? ?Danny Hartmann, NP ? ? ?

## 2021-11-30 DIAGNOSIS — R112 Nausea with vomiting, unspecified: Secondary | ICD-10-CM | POA: Diagnosis not present

## 2021-11-30 DIAGNOSIS — B9689 Other specified bacterial agents as the cause of diseases classified elsewhere: Secondary | ICD-10-CM | POA: Diagnosis not present

## 2021-11-30 DIAGNOSIS — J019 Acute sinusitis, unspecified: Secondary | ICD-10-CM | POA: Diagnosis not present

## 2021-11-30 DIAGNOSIS — J209 Acute bronchitis, unspecified: Secondary | ICD-10-CM | POA: Diagnosis not present

## 2021-11-30 DIAGNOSIS — Z03818 Encounter for observation for suspected exposure to other biological agents ruled out: Secondary | ICD-10-CM | POA: Diagnosis not present

## 2021-11-30 DIAGNOSIS — U071 COVID-19: Secondary | ICD-10-CM | POA: Diagnosis not present

## 2021-12-17 DIAGNOSIS — E291 Testicular hypofunction: Secondary | ICD-10-CM | POA: Diagnosis not present

## 2021-12-17 DIAGNOSIS — E221 Hyperprolactinemia: Secondary | ICD-10-CM | POA: Diagnosis not present

## 2021-12-22 DIAGNOSIS — D225 Melanocytic nevi of trunk: Secondary | ICD-10-CM | POA: Diagnosis not present

## 2021-12-22 DIAGNOSIS — L821 Other seborrheic keratosis: Secondary | ICD-10-CM | POA: Diagnosis not present

## 2021-12-22 DIAGNOSIS — L573 Poikiloderma of Civatte: Secondary | ICD-10-CM | POA: Diagnosis not present

## 2021-12-22 DIAGNOSIS — I781 Nevus, non-neoplastic: Secondary | ICD-10-CM | POA: Diagnosis not present

## 2021-12-22 DIAGNOSIS — L570 Actinic keratosis: Secondary | ICD-10-CM | POA: Diagnosis not present

## 2021-12-24 DIAGNOSIS — E221 Hyperprolactinemia: Secondary | ICD-10-CM | POA: Diagnosis not present

## 2021-12-24 DIAGNOSIS — E291 Testicular hypofunction: Secondary | ICD-10-CM | POA: Diagnosis not present

## 2022-01-07 DIAGNOSIS — G4733 Obstructive sleep apnea (adult) (pediatric): Secondary | ICD-10-CM | POA: Diagnosis not present

## 2022-01-07 DIAGNOSIS — H6123 Impacted cerumen, bilateral: Secondary | ICD-10-CM | POA: Diagnosis not present

## 2022-01-07 DIAGNOSIS — J301 Allergic rhinitis due to pollen: Secondary | ICD-10-CM | POA: Diagnosis not present

## 2022-01-07 DIAGNOSIS — H903 Sensorineural hearing loss, bilateral: Secondary | ICD-10-CM | POA: Diagnosis not present

## 2022-01-07 DIAGNOSIS — H9313 Tinnitus, bilateral: Secondary | ICD-10-CM | POA: Diagnosis not present

## 2022-01-15 DIAGNOSIS — H16251 Phlyctenular keratoconjunctivitis, right eye: Secondary | ICD-10-CM | POA: Diagnosis not present

## 2022-01-27 ENCOUNTER — Other Ambulatory Visit (INDEPENDENT_AMBULATORY_CARE_PROVIDER_SITE_OTHER): Payer: Self-pay | Admitting: Nurse Practitioner

## 2022-01-27 DIAGNOSIS — R600 Localized edema: Secondary | ICD-10-CM

## 2022-01-27 DIAGNOSIS — R252 Cramp and spasm: Secondary | ICD-10-CM

## 2022-01-28 ENCOUNTER — Ambulatory Visit (INDEPENDENT_AMBULATORY_CARE_PROVIDER_SITE_OTHER): Payer: Medicare HMO | Admitting: Nurse Practitioner

## 2022-01-28 ENCOUNTER — Encounter (INDEPENDENT_AMBULATORY_CARE_PROVIDER_SITE_OTHER): Payer: Self-pay | Admitting: Nurse Practitioner

## 2022-01-28 ENCOUNTER — Ambulatory Visit (INDEPENDENT_AMBULATORY_CARE_PROVIDER_SITE_OTHER): Payer: Medicare HMO

## 2022-01-28 VITALS — BP 117/75 | HR 64 | Resp 17 | Ht 70.0 in | Wt 190.0 lb

## 2022-01-28 DIAGNOSIS — R252 Cramp and spasm: Secondary | ICD-10-CM

## 2022-01-28 DIAGNOSIS — E785 Hyperlipidemia, unspecified: Secondary | ICD-10-CM | POA: Diagnosis not present

## 2022-01-28 DIAGNOSIS — R600 Localized edema: Secondary | ICD-10-CM | POA: Diagnosis not present

## 2022-02-07 ENCOUNTER — Encounter (INDEPENDENT_AMBULATORY_CARE_PROVIDER_SITE_OTHER): Payer: Self-pay | Admitting: Nurse Practitioner

## 2022-02-07 NOTE — Progress Notes (Signed)
Subjective:    Patient ID: Danny Ortiz, male    DOB: 1956/04/26, 66 y.o.   MRN: [id number] No chief complaint on file.   Danny Ortiz is a 66 year old male that presents today as a referral from Ms. Rollene Rotunda, FNP in regards to lower extremity leg cramps as well as lower extremity edema.  These leg cramps are not predictable such as always occurring at night or recurring with ambulation.  The patient does not note that 1 lower extremity is more affected than the other.  He also notes that getting to the day there is some indentation noted on his socks.  He denies classic claudication-like symptoms or rest pain.    Today noninvasive study showed no evidence of DVT or superficial thrombophlebitis ry. no evidence of deep venous insufficiency bilaterally.  There is evidence of reflux in the great saphenous vein on the right but no reflux on the left.  The patient has an ABI of 1.28 on the right and 1.30 on the left.  He has normal TBI's bilaterally.  The patient has strong triphasic tibial artery waveforms bilaterally with normal toe waveforms bilaterally.    Review of Systems  Cardiovascular:  Positive for leg swelling.  Musculoskeletal:        Leg cramps  All other systems reviewed and are negative.      Objective:   Physical Exam Vitals reviewed.  HENT:     Head: Normocephalic.  Cardiovascular:     Rate and Rhythm: Normal rate.     Pulses: Normal pulses.  Pulmonary:     Effort: Pulmonary effort is normal.  Skin:    General: Skin is warm and dry.  Neurological:     Mental Status: He is alert and oriented to person, place, and time.  Psychiatric:        Mood and Affect: Mood normal.        Behavior: Behavior normal.        Thought Content: Thought content normal.        Judgment: Judgment normal.     BP 117/75 (BP Location: Right Arm)   Pulse 64   Resp 17   Ht '5\' 10"'$  (1.778 m)   Wt 190 lb (86.2 kg)   BMI 27.26 kg/m   Past Medical History:  Diagnosis Date    Balanitis    Bronchitis    Heartburn    Sleep apnea     Social History   Socioeconomic History   Marital status: Married    Spouse name: Not on file   Number of children: Not on file   Years of education: Not on file   Highest education level: Not on file  Occupational History   Not on file  Tobacco Use   Smoking status: Never   Smokeless tobacco: Never  Vaping Use   Vaping Use: Never used  Substance and Sexual Activity   Alcohol use: Yes    Alcohol/week: 0.0 standard drinks of alcohol    Comment: OCCASIONALLY   Drug use: No   Sexual activity: Never  Other Topics Concern   Not on file  Social History Narrative   Not on file   Social Determinants of Health   Financial Resource Strain: Not on file  Food Insecurity: Not on file  Transportation Needs: Not on file  Physical Activity: Not on file  Stress: Not on file  Social Connections: Not on file  Intimate Partner Violence: Not on file    Past Surgical History:  Procedure Laterality Date   TONSILLECTOMY AND ADENOIDECTOMY      Family History  Problem Relation Age of Onset   Heart disease Mother    Lymphoma Mother    Colon polyps Maternal Grandmother    Prostate cancer Maternal Grandfather    Kidney disease Neg Hx     Allergies  Allergen Reactions   Azithromycin Other (See Comments)    Increased HR   Penicillins Nausea And Vomiting   Prednisone Other (See Comments)    Increased hr   Wellbutrin [Bupropion] Other (See Comments)    Jittery/shakes, dry mouth, felt bad       Latest Ref Rng & Units 02/16/2021   11:05 AM 11/14/2019    8:08 AM 01/12/2018   11:41 AM  CBC  WBC 3.4 - 10.8 x10E3/uL 8.2  6.6  7.0   Hemoglobin 13.0 - 17.7 g/dL 15.8  15.4  14.7   Hematocrit 37.5 - 51.0 % 45.4  44.3  41.6   Platelets 150 - 450 x10E3/uL 225  225  219       CMP     Component Value Date/Time   NA 144 10/20/2021 1552   K 4.7 10/20/2021 1552   CL 103 10/20/2021 1552   CO2 23 10/20/2021 1552   GLUCOSE 87  10/20/2021 1552   GLUCOSE 102 (H) 07/14/2011 1022   BUN 20 10/20/2021 1552   CREATININE 1.19 10/20/2021 1552   CALCIUM 9.4 10/20/2021 1552   PROT 6.6 10/20/2021 1552   ALBUMIN 4.7 10/20/2021 1552   AST 13 10/20/2021 1552   ALT 12 10/20/2021 1552   ALKPHOS 73 10/20/2021 1552   BILITOT 0.4 10/20/2021 1552   GFRNONAA 79 11/14/2019 0808   GFRAA 91 11/14/2019 0808     VAS [place] ABI WITH/WO TBI  Result Date: 01/28/2022  LOWER EXTREMITY DOPPLER STUDY Patient Name:  Danny Ortiz  Date of Exam:   01/28/2022 Medical Rec #: [id number]        Accession #:    [id number] Date of Birth: [birth year]        Patient Gender: M Patient Age:   62 years Exam Location:  [facility] Procedure:      VAS [place] ABI WITH/WO TBI Referring Phys: Hortencia Pilar --------------------------------------------------------------------------------  Indications: Rest pain.  Performing Technologist: Almira Coaster RVS  Examination Guidelines: A complete evaluation includes at minimum, Doppler waveform signals and systolic blood pressure reading at the level of bilateral brachial, anterior tibial, and posterior tibial arteries, when vessel segments are accessible. Bilateral testing is considered an integral part of a complete examination. Photoelectric Plethysmograph (PPG) waveforms and toe systolic pressure readings are included as required and additional duplex testing as needed. Limited examinations for reoccurring indications may be performed as noted.  ABI Findings: +---------+------------------+-----+---------+--------+ Right    Rt Pressure (mmHg)IndexWaveform Comment  +---------+------------------+-----+---------+--------+ Brachial 118                                      +---------+------------------+-----+---------+--------+ ATA      154               1.25 triphasic         +---------+------------------+-----+---------+--------+ PTA      157               1.28 triphasic          +---------+------------------+-----+---------+--------+ [facility]  1.03 Normal            +---------+------------------+-----+---------+--------+ +---------+------------------+-----+---------+-------+ Left     Lt Pressure (mmHg)IndexWaveform Comment +---------+------------------+-----+---------+-------+ Brachial 123                                     +---------+------------------+-----+---------+-------+ ATA      150               1.22 triphasic        +---------+------------------+-----+---------+-------+ PTA      160               1.30 triphasic        +---------+------------------+-----+---------+-------+ Great Toe120               0.98 Normal           +---------+------------------+-----+---------+-------+ +-------+-----------+-----------+------------+------------+ ABI/TBIToday's ABIToday's TBIPrevious ABIPrevious TBI +-------+-----------+-----------+------------+------------+ Right  1.28       1.03                                +-------+-----------+-----------+------------+------------+ Left   1.30       .98                                 +-------+-----------+-----------+------------+------------+  Summary: Right: Resting right ankle-brachial index is within normal range. No evidence of significant right lower extremity arterial disease. The right toe-brachial index is normal. Left: Resting left ankle-brachial index is within normal range. No evidence of significant left lower extremity arterial disease. The left toe-brachial index is normal.  *See table(s) above for measurements and observations.  Electronically signed by Hortencia Pilar MD on 01/28/2022 at 4:11:47 PM.    Final        Assessment & Plan:   1. Leg cramps While the patient does have some reflux in his right lower extremity there are no vascular abnormalities in the left lower extremity.  The patient has cramping bilaterally.  He does not note that symptoms worsen with  left versus the other which reasons that intervention to the right lower extremity will likely not improve his symptoms at all.  He also denies any significant systems that are typically associated with varicose veins such as burning, stinging or itching or difficulty with ADLs.  Based on this intervention is not recommended at this time.  Patient will continue to follow-up with [place] on as-needed basis.  Patient is advised to follow-up with PCP for further evaluation for cause of cramps.  2. Hyperlipidemia, unspecified hyperlipidemia type Continue statin as ordered and reviewed, no changes at this time    Current Outpatient Medications on File Prior to Visit  Medication Sig Dispense Refill   albuterol (VENTOLIN HFA) 108 (90 Base) MCG/ACT inhaler SMARTSIG:2 Puff(s) Via Inhaler 4 Times Daily PRN     fluticasone (FLONASE) 50 MCG/ACT nasal spray Place 2 sprays into both nostrils daily.     omeprazole (PRILOSEC) 20 MG capsule Take 20 mg by mouth daily. prn     ondansetron (ZOFRAN-ODT) 4 MG disintegrating tablet Take 4 mg by mouth every 8 (eight) hours as needed.     sildenafil (REVATIO) 20 MG tablet TAKE 1-5 TABLETS BY MOUTH DAILY AS NEEDED 50 tablet 0   tadalafil (CIALIS) 20 MG tablet Take 1 tablet (20 mg total) by mouth every three (3)  days as needed for erectile dysfunction. 30 tablet 3   valACYclovir (VALTREX) 500 MG tablet Take 1 tablet (500 mg total) by mouth 2 (two) times daily. 180 tablet 3   No current facility-administered medications on file prior to visit.    There are no Patient Instructions on file for this visit. No follow-ups on file.   Kris Hartmann, NP

## 2022-02-08 ENCOUNTER — Telehealth: Payer: Self-pay

## 2022-02-08 NOTE — Telephone Encounter (Signed)
pt will be out of town on 6.26.23 and wanted to move his appt to the next week / next cpe opening with Dr. Rosanna Randy is in December/ can pt be worked in sooner / please advise

## 2022-02-16 DIAGNOSIS — E291 Testicular hypofunction: Secondary | ICD-10-CM | POA: Diagnosis not present

## 2022-02-16 DIAGNOSIS — N529 Male erectile dysfunction, unspecified: Secondary | ICD-10-CM | POA: Diagnosis not present

## 2022-02-16 DIAGNOSIS — J45909 Unspecified asthma, uncomplicated: Secondary | ICD-10-CM | POA: Diagnosis not present

## 2022-02-16 DIAGNOSIS — Z008 Encounter for other general examination: Secondary | ICD-10-CM | POA: Diagnosis not present

## 2022-02-22 ENCOUNTER — Encounter: Payer: Self-pay | Admitting: Family Medicine

## 2022-03-06 ENCOUNTER — Other Ambulatory Visit: Payer: Self-pay | Admitting: Family Medicine

## 2022-03-08 MED ORDER — SILDENAFIL CITRATE 20 MG PO TABS: ORAL_TABLET | ORAL | 0 refills | Status: DC

## 2022-03-12 ENCOUNTER — Ambulatory Visit (INDEPENDENT_AMBULATORY_CARE_PROVIDER_SITE_OTHER): Payer: Medicare HMO | Admitting: Physician Assistant

## 2022-03-12 ENCOUNTER — Encounter: Payer: Self-pay | Admitting: Physician Assistant

## 2022-03-12 ENCOUNTER — Other Ambulatory Visit: Payer: Self-pay | Admitting: Physician Assistant

## 2022-03-12 VITALS — BP 94/67 | HR 72 | Temp 98.4°F | Resp 16 | Wt 192.8 lb

## 2022-03-12 DIAGNOSIS — Z1211 Encounter for screening for malignant neoplasm of colon: Secondary | ICD-10-CM

## 2022-03-12 DIAGNOSIS — K648 Other hemorrhoids: Secondary | ICD-10-CM | POA: Diagnosis not present

## 2022-03-12 MED ORDER — HYDROCORTISONE ACETATE 25 MG RE SUPP: 25.0000 mg | Freq: Two times a day (BID) | RECTAL | 0 refills | Status: DC

## 2022-03-12 MED ORDER — NITROGLYCERIN 0.4 % RE OINT: TOPICAL_OINTMENT | RECTAL | 0 refills | Status: DC

## 2022-03-12 NOTE — Progress Notes (Unsigned)
      I,Jana Dalyla Chui,acting as a Education administrator for [employer], PA-C.,have documented all relevant documentation on the behalf of Mardene Speak, PA-C,as directed by  Mardene Speak, PA-C while in the presence of Mardene Speak, PA-C.e  Established patient visit   Patient: Danny Ortiz   DOB: Oct 23, 1955   66 y.o. Male  MRN: [id number] Visit Date: 03/12/2022  Today's healthcare provider: Mardene Speak, PA-C   Chief Complaint  Patient presents with   Hemorrhoids  Hemorrhoids   Subjective    Patient presents for painful hemorrhoid.  Onset 3-4 weeks.  Using Preparation H with no relief.    Medications: Outpatient Medications Prior to Visit  Medication Sig   omeprazole (PRILOSEC) 20 MG capsule Take 20 mg by mouth daily. prn   sildenafil (REVATIO) 20 MG tablet TAKE 1-5 TABLETS BY MOUTH DAILY AS NEEDED   tadalafil (CIALIS) 20 MG tablet Take 1 tablet (20 mg total) by mouth every three (3) days as needed for erectile dysfunction.   valACYclovir (VALTREX) 500 MG tablet Take 1 tablet (500 mg total) by mouth 2 (two) times daily.   albuterol (VENTOLIN HFA) 108 (90 Base) MCG/ACT inhaler SMARTSIG:2 Puff(s) Via Inhaler 4 Times Daily PRN (Patient not taking: Reported on 03/12/2022)   fluticasone (FLONASE) 50 MCG/ACT nasal spray Place 2 sprays into both nostrils daily. (Patient not taking: Reported on 03/12/2022)   ondansetron (ZOFRAN-ODT) 4 MG disintegrating tablet Take 4 mg by mouth every 8 (eight) hours as needed. (Patient not taking: Reported on 03/12/2022)   No facility-administered medications prior to visit.    Review of Systems  {Labs  Heme  Chem  Endocrine  Serology  Results Review (optional):23779}   Objective    BP 94/67 (BP Location: Right Arm, Patient Position: Sitting, Cuff Size: Normal)   Pulse 72   Temp 98.4 F (36.9 C) (Oral)   Resp 16   Wt 192 lb 12.8 oz (87.5 kg)   SpO2 96%   BMI 27.66 kg/m  {Show previous vital signs (optional):23777}  Physical Exam  ***  No  results found for any visits on 03/12/22.  Assessment & Plan     ***  No follow-ups on file.      {provider attestation***:1}   Mardene Speak, Hershal Coria  [facility] [phone] (phone) [phone] (fax)  [place]

## 2022-03-12 NOTE — Telephone Encounter (Signed)
Requested medication (s) are due for refill today: no  Requested medication (s) are on the active medication list: yes  Last refill:  today  Future visit scheduled: had OV today  Notes to clinic:  Pharmacy comment: Alternative Requested:NOT COVERED.     Requested Prescriptions  Pending Prescriptions Disp Refills   hydrocortisone (ANUSOL-HC) 25 MG suppository [Pharmacy Med Name: HYDROCORTISONE AC 25 MG SUPP] 12 suppository 0    Sig: PLACE 1 SUPPOSITORY RECTALLY 2 TIMES DAILY.     Off-Protocol Failed - 03/12/2022  5:24 PM      Failed - Medication not assigned to a protocol, review manually.      Passed - Valid encounter within last 12 months    Recent Outpatient Visits           Today Colon cancer screening   [facility], [place]   4 months ago Leg cramps   [facility] Gwyneth Sprout, FNP   1 year ago Annual physical exam   [facility] Jerrol Banana., MD   1 year ago Neck pain   [facility] Trinna Post, [place]   1 year ago [place] Carles Collet [place], [place]

## 2022-03-18 ENCOUNTER — Encounter: Payer: Self-pay | Admitting: Family Medicine

## 2022-03-18 ENCOUNTER — Ambulatory Visit (INDEPENDENT_AMBULATORY_CARE_PROVIDER_SITE_OTHER): Payer: Medicare HMO | Admitting: Family Medicine

## 2022-03-18 VITALS — BP 101/66 | HR 65 | Resp 16 | Wt 192.0 lb

## 2022-03-18 DIAGNOSIS — K648 Other hemorrhoids: Secondary | ICD-10-CM | POA: Diagnosis not present

## 2022-03-18 NOTE — Progress Notes (Signed)
Established patient visit  I,April Miller,acting as a scribe for Wilhemena Durie, MD.,have documented all relevant documentation on the behalf of Wilhemena Durie, MD,as directed by  Wilhemena Durie, MD while in the presence of Wilhemena Durie, MD.  Patient: Danny Ortiz   DOB: 02-11-1956   66 y.o. Male  MRN: [id number] Visit Date: 03/18/2022  Today's healthcare provider: Wilhemena Durie, MD   Chief Complaint  Patient presents with   Hemorrhoids   Subjective    HPI   Patient has had hemorrhoid for about 1 month. Patient states there has been no blood or pain. Patient states he has itching and discomfort.  Some straining with stool/obstipation  Medications: Outpatient Medications Prior to Visit  Medication Sig   omeprazole (PRILOSEC) 20 MG capsule Take 20 mg by mouth daily. prn   sildenafil (REVATIO) 20 MG tablet TAKE 1-5 TABLETS BY MOUTH DAILY AS NEEDED   valACYclovir (VALTREX) 500 MG tablet Take 1 tablet (500 mg total) by mouth 2 (two) times daily.   hydrocortisone (ANUSOL-HC) 25 MG suppository Place 1 suppository (25 mg total) rectally 2 (two) times daily. (Patient not taking: Reported on 03/18/2022)   Nitroglycerin 0.4 % OINT Apply 1 inch (375 mg) ointment intra-anally every 12 hours for anal fissure (Patient not taking: Reported on 03/18/2022)   [DISCONTINUED] albuterol (VENTOLIN HFA) 108 (90 Base) MCG/ACT inhaler SMARTSIG:2 Puff(s) Via Inhaler 4 Times Daily PRN (Patient not taking: Reported on 03/12/2022)   [DISCONTINUED] fluticasone (FLONASE) 50 MCG/ACT nasal spray Place 2 sprays into both nostrils daily. (Patient not taking: Reported on 03/12/2022)   [DISCONTINUED] ondansetron (ZOFRAN-ODT) 4 MG disintegrating tablet Take 4 mg by mouth every 8 (eight) hours as needed. (Patient not taking: Reported on 03/12/2022)   [DISCONTINUED] tadalafil (CIALIS) 20 MG tablet Take 1 tablet (20 mg total) by mouth every three (3) days as needed for erectile dysfunction. (Patient  not taking: Reported on 03/18/2022)   No facility-administered medications prior to visit.    Review of Systems  Constitutional:  Negative for appetite change, chills and fever.  Respiratory:  Negative for chest tightness, shortness of breath and wheezing.   Cardiovascular:  Negative for chest pain and palpitations.  Gastrointestinal:  Negative for abdominal pain, nausea and vomiting.    Last CBC Lab Results  Component Value Date   WBC 8.2 02/16/2021   HGB 15.8 02/16/2021   HCT 45.4 02/16/2021   MCV 89 02/16/2021   MCH 31.0 02/16/2021   RDW 11.7 02/16/2021   PLT 225 02/16/2021       Objective    BP 101/66 (BP Location: Left Arm, Patient Position: Sitting, Cuff Size: Large)   Pulse 65   Resp 16   Wt 192 lb (87.1 kg)   SpO2 97%   BMI 27.55 kg/m  BP Readings from Last 3 Encounters:  03/18/22 101/66  03/12/22 94/67  01/28/22 117/75   Wt Readings from Last 3 Encounters:  03/18/22 192 lb (87.1 kg)  03/12/22 192 lb 12.8 oz (87.5 kg)  01/28/22 190 lb (86.2 kg)      Physical Exam Vitals reviewed.  Constitutional:      General: He is not in acute distress.    Appearance: He is well-developed.  HENT:     Head: Normocephalic and atraumatic.     Right Ear: Hearing normal.     Left Ear: Hearing normal.     Nose: Nose normal.  Eyes:     General: Lids are normal.  No scleral icterus.       Right eye: No discharge.        Left eye: No discharge.     Conjunctiva/sclera: Conjunctivae normal.  Cardiovascular:     Rate and Rhythm: Normal rate and regular rhythm.     Heart sounds: Normal heart sounds.  Pulmonary:     Effort: Pulmonary effort is normal. No respiratory distress.  Genitourinary:    Comments: Perianal exam reveals a small thrombosed mildly tender hemorrhoid Skin:    Findings: No lesion or rash.  Neurological:     General: No focal deficit present.     Mental Status: He is alert and oriented to person, place, and time.  Psychiatric:        Mood and Affect:  Mood normal.        Speech: Speech normal.        Behavior: Behavior normal.        Thought Content: Thought content normal.        Judgment: Judgment normal.       No results found for any visits on 03/18/22.  Assessment & Plan     1. Other hemorrhoid  hemorrhoid does not need to be incised today. Refer to GI for options also. Stop all topicals and use sitz bath twice a day for the next week.   No follow-ups on file.      I, Wilhemena Durie, MD, have reviewed all documentation for this visit. The documentation on 03/22/22 for the exam, diagnosis, procedures, and orders are all accurate and complete.    Davarious Tumbleson Cranford Mon, MD  [facility] [phone] (phone) [phone] (fax)  [place]

## 2022-03-18 NOTE — Patient Instructions (Signed)
TAKE BATHS FOR THE NEXT COUPLE OF WEEKS.

## 2022-03-31 ENCOUNTER — Telehealth: Payer: Self-pay

## 2022-03-31 NOTE — Telephone Encounter (Signed)
Please advise other treatment options?

## 2022-03-31 NOTE — Telephone Encounter (Signed)
Copied from [place] [phone]. Topic: General - Other >> Mar 31, 2022  4:20 PM Leilani Able wrote: Pt has called and due to his issue states he just wants to talk to [place]. When pt came in the office was to to soak in a warm bathe and that is not working. Pt wants to talk to [place] about other options. Pt very kind, just wants [place]. [place] [phone]

## 2022-04-02 ENCOUNTER — Other Ambulatory Visit: Payer: Self-pay | Admitting: *Deleted

## 2022-04-02 DIAGNOSIS — K648 Other hemorrhoids: Secondary | ICD-10-CM

## 2022-04-02 NOTE — Telephone Encounter (Signed)
Patient was advised. He is agreeable with referral. Referral placed.

## 2022-04-05 ENCOUNTER — Ambulatory Visit: Payer: Medicare HMO | Admitting: Dermatology

## 2022-04-15 ENCOUNTER — Encounter: Payer: Self-pay | Admitting: Surgery

## 2022-04-15 ENCOUNTER — Ambulatory Visit: Payer: Medicare HMO | Admitting: Surgery

## 2022-04-15 VITALS — BP 118/79 | HR 65 | Temp 97.9°F | Ht 70.0 in | Wt 190.8 lb

## 2022-04-15 DIAGNOSIS — K641 Second degree hemorrhoids: Secondary | ICD-10-CM | POA: Diagnosis not present

## 2022-04-15 NOTE — Patient Instructions (Addendum)
You may try Psyllium Husk OTC to help with constipation.    Advised to pursue a goal of 25 to 30 g of fiber daily.  Made aware that the majority of this may be through natural sources, but advised to be aware of actual consumption and to ensure minimal consumption by daily supplementation.  Various forms of supplements discussed.  Recommended Psyllium husk, that mixes well with applesauce, or the powder which goes down well shaken with chocolate milk.  Strongly advised to consume more fluids to ensure adequate hydration, instructed to watch color of urine to determine adequacy of hydration.  Clarity is pursued in urine output, and bowel activity that correlates to significant meal intake.   We need to avoid deferring having bowel movements, advised to take the time at the first sign of sensation, typically following meals, and in the morning.   Subsequent utilization of MiraLAX may be needed ensure at least daily movement, ideally twice daily bowel movements.  If multiple doses of MiraLAX are necessary utilize them. Never skip a day...  To be regular, we must do the above EVERY day.     If you have any concerns or questions, please feel free to call our office. Follow up as needed.    Hemorrhoids Hemorrhoids are swollen veins that may develop: In the butt (rectum). These are called internal hemorrhoids. Around the opening of the butt (anus). These are called external hemorrhoids. Hemorrhoids can cause pain, itching, or bleeding. Most of the time, they do not cause serious problems. They usually get better with diet changes, lifestyle changes, and other home treatments. What are the causes? This condition may be caused by: Having trouble pooping (constipation). Pushing hard (straining) to poop. Watery poop (diarrhea). Pregnancy. Being very overweight (obese). Sitting for long periods of time. Heavy lifting or other activity that causes you to strain. Anal sex. Riding a bike for a long  period of time. What are the signs or symptoms? Symptoms of this condition include: Pain. Itching or soreness in the butt. Bleeding from the butt. Leaking poop. Swelling in the area. One or more lumps around the opening of your butt. How is this diagnosed? A doctor can often diagnose this condition by looking at the affected area. The doctor may also: Do an exam that involves feeling the area with a gloved hand (digital rectal exam). Examine the area inside your butt using a small tube (anoscope). Order blood tests. This may be done if you have lost a lot of blood. Have you get a test that involves looking inside the colon using a flexible tube with a camera on the end (sigmoidoscopy or colonoscopy). How is this treated? This condition can usually be treated at home. Your doctor may tell you to change what you eat, make lifestyle changes, or try home treatments. If these do not help, procedures can be done to remove the hemorrhoids or make them smaller. These may involve: Placing rubber bands at the base of the hemorrhoids to cut off their blood supply. Injecting medicine into the hemorrhoids to shrink them. Shining a type of light energy onto the hemorrhoids to cause them to fall off. Doing surgery to remove the hemorrhoids or cut off their blood supply. Follow these instructions at home: Eating and drinking  Eat foods that have a lot of fiber in them. These include whole grains, beans, nuts, fruits, and vegetables. Ask your doctor about taking products that have added fiber (fibersupplements). Reduce the amount of fat in your diet.  You can do this by: Eating low-fat dairy products. Eating less red meat. Avoiding processed foods. Drink enough fluid to keep your pee (urine) pale yellow. Managing pain and swelling  Take a warm-water bath (sitz bath) for 20 minutes to ease pain. Do this 3-4 times a day. You may do this in a bathtub or using a portable sitz bath that fits over the  toilet. If told, put ice on the painful area. It may be helpful to use ice between your warm baths. Put ice in a plastic bag. Place a towel between your skin and the bag. Leave the ice on for 20 minutes, 2-3 times a day. General instructions Take over-the-counter and prescription medicines only as told by your doctor. Medicated creams and medicines may be used as told. Exercise often. Ask your doctor how much and what kind of exercise is best for you. Go to the bathroom when you have the urge to poop. Do not wait. Avoid pushing too hard when you poop. Keep your butt dry and clean. Use wet toilet paper or moist towelettes after pooping. Do not sit on the toilet for a long time. Keep all follow-up visits as told by your doctor. This is important. Contact a doctor if you: Have pain and swelling that do not get better with treatment or medicine. Have trouble pooping. Cannot poop. Have pain or swelling outside the area of the hemorrhoids. Get help right away if you have: Bleeding that will not stop. Summary Hemorrhoids are swollen veins in the butt or around the opening of the butt. They can cause pain, itching, or bleeding. Eat foods that have a lot of fiber in them. These include whole grains, beans, nuts, fruits, and vegetables. Take a warm-water bath (sitz bath) for 20 minutes to ease pain. Do this 3-4 times a day. This information is not intended to replace advice given to you by your health care provider. Make sure you discuss any questions you have with your health care provider. Document Revised: 02/25/2021 Document Reviewed: 02/25/2021 Elsevier Patient Education  [place].

## 2022-04-15 NOTE — Progress Notes (Signed)
Patient ID: Danny Ortiz, male   DOB: 14-Dec-1955, 66 y.o.   MRN: [id number]  Chief Complaint: Hemorrhoids  History of Present Illness Danny Ortiz is a 66 y.o. male with a 46-monthhistory of a hemorrhoid which began [place]  [place] some associated constipation with that event.  Initially began with some pain, burning and itching.  But all these have gone away since that time.  There is a lingering hemorrhoid present.  He currently takes oatmeal on a regular basis, does not like prunes.  Has otherwise not pursued fiber supplementation.  He reports his bowel movements are typically daily, but admits to frequently skipping a day or 2.  He denies any bleeding.  He has utilized sitz bath's and Preparation H, he has had some relief from sitz bath but really no help from the Preparation H.  Cannot recall when his last colonoscopy was.  Past Medical History Past Medical History:  Diagnosis Date   Balanitis    Bronchitis    Heartburn    Sleep apnea       Past Surgical History:  Procedure Laterality Date   TONSILLECTOMY AND ADENOIDECTOMY      Allergies  Allergen Reactions   Azithromycin Other (See Comments)    Increased HR   Penicillins Nausea And Vomiting   Prednisone Other (See Comments)    Increased hr   Wellbutrin [Bupropion] Other (See Comments)    Jittery/shakes, dry mouth, felt bad    Current Outpatient Medications  Medication Sig Dispense Refill   omeprazole (PRILOSEC) 20 MG capsule Take 20 mg by mouth daily. prn     sildenafil (REVATIO) 20 MG tablet TAKE 1-5 TABLETS BY MOUTH DAILY AS NEEDED 50 tablet 0   valACYclovir (VALTREX) 500 MG tablet Take 1 tablet (500 mg total) by mouth 2 (two) times daily. 180 tablet 3   No current facility-administered medications for this visit.    Family History Family History  Problem Relation Age of Onset   Heart disease Mother    Lymphoma Mother    Colon polyps Maternal Grandmother    Prostate  cancer Maternal Grandfather    Kidney disease Neg Hx       Social History Social History   Tobacco Use   Smoking status: Never   Smokeless tobacco: Never  Vaping Use   Vaping Use: Never used  Substance Use Topics   Alcohol use: Yes    Alcohol/week: 0.0 standard drinks of alcohol    Comment: OCCASIONALLY   Drug use: No        Review of Systems  Constitutional: Negative.   HENT:  Positive for tinnitus.   Eyes: Negative.   Respiratory: Negative.    Cardiovascular: Negative.   Gastrointestinal: Negative.   Genitourinary: Negative.   Skin: Negative.   Neurological: Negative.   Psychiatric/Behavioral: Negative.        Physical Exam Blood pressure 118/79, pulse 65, temperature 97.9 F (36.6 C), temperature source Oral, height '5\' 10"'$  (1.778 m), weight 190 lb 12.8 oz (86.5 kg), SpO2 95 %. Last Weight  Most recent update: 04/15/2022  8:53 AM    Weight  86.5 kg (190 lb 12.8 oz)             CONSTITUTIONAL: Well developed, and nourished, appropriately responsive and aware without distress.   EYES: Sclera non-icteric.   EARS, NOSE, MOUTH AND THROAT:  The oropharynx is clear. Oral mucosa is pink and moist.   Hearing is intact  to voice.  NECK: Trachea is midline, and there is no jugular venous distension.  LYMPH NODES:  Lymph nodes in the neck are not enlarged. RESPIRATORY:  Normal respiratory effort without pathologic use of accessory muscles. CARDIOVASCULAR: Heart is regular in rate and rhythm. GI: The abdomen is  soft, nontender, and nondistended.  GU: DRE completed, there is a well defined singular hemorrhoidal column right posterior.  It appears to be consistent with prior thrombosis, currently without evidence of inflammation or induration.  Is nontender.  There is no evidence of any fissure, fistula or other perianal process present.  No stool in vault, no gross blood. MUSCULOSKELETAL:  Symmetrical muscle tone appreciated in all four extremities.    SKIN: Skin turgor  is normal. No pathologic skin lesions appreciated.  NEUROLOGIC:  Motor and sensation appear grossly normal.  Cranial nerves are grossly without defect. PSYCH:  Alert and oriented to person, place and time. Affect is appropriate for situation.  Data Reviewed I have personally reviewed what is currently available of the patient's imaging, recent labs and medical records.   Labs:     Latest Ref Rng & Units 02/16/2021   11:05 AM 11/14/2019    8:08 AM 01/12/2018   11:41 AM  CBC  WBC 3.4 - 10.8 x10E3/uL 8.2  6.6  7.0   Hemoglobin 13.0 - 17.7 g/dL 15.8  15.4  14.7   Hematocrit 37.5 - 51.0 % 45.4  44.3  41.6   Platelets 150 - 450 x10E3/uL 225  225  219       Latest Ref Rng & Units 10/20/2021    3:52 PM 02/16/2021   11:05 AM 11/14/2019    8:08 AM  CMP  Glucose 70 - 99 mg/dL 87  86  83   BUN 8 - 27 mg/dL '20  15  17   '$ Creatinine 0.76 - 1.27 mg/dL 1.19  1.05  1.00   Sodium 134 - 144 mmol/L 144  137  143   Potassium 3.5 - 5.2 mmol/L 4.7  4.6  4.3   Chloride 96 - 106 mmol/L 103  99  104   CO2 20 - 29 mmol/L '23  25  26   '$ Calcium 8.6 - 10.2 mg/dL 9.4  9.6  9.1   Total Protein 6.0 - 8.5 g/dL 6.6  6.9  6.5   Total Bilirubin 0.0 - 1.2 mg/dL 0.4  0.6  0.7   Alkaline Phos 44 - 121 IU/L 73  70  74   AST 0 - 40 IU/L '13  17  13   '$ ALT 0 - 44 IU/L '12  14  16       '$ Imaging:  Within last 24 hrs: No results found.  Assessment    Hemorrhoid, recent thrombosis, progressively improving. 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  Made aware that the majority of this may be through natural sources, but advised to be aware of actual consumption and to ensure minimal consumption by daily supplementation.  Various forms of supplements discussed.  Recommended Psyllium husk, that mixes well with applesauce, or the powder which goes down well shaken with chocolate milk.  Strongly advised to consume more fluids to ensure adequate hydration, instructed to watch color of urine to determine adequacy of hydration.  Clarity is pursued in urine output, and bowel activity that correlates to significant meal intake.   We need to avoid deferring having bowel movements, advised to take the time at the first sign of sensation, typically following meals, and in the morning.   Subsequent utilization of MiraLAX may be needed ensure at least daily movement, ideally twice daily bowel movements.  If multiple doses of MiraLAX are necessary utilize them. Never skip a day...  To be regular, we must do the above EVERY day.   Anticipate this will continue to improve and not progress should the above regimen or something similar be applied.  I would like to see this gentleman again in a month or 2 to assess continued regression of the current hemorrhoidal process.  Appreciate the opportunity assist in the care of this pleasant gentleman.  Face-to-face time spent with the patient and accompanying care providers(if present) was 30 minutes, with more than 50% of the time spent counseling,  educating, and coordinating care of the patient.    These notes generated with voice recognition software. I apologize for typographical errors.  Ronny Bacon M.D., FACS 04/15/2022, 9:23 AM

## 2022-04-28 ENCOUNTER — Ambulatory Visit (INDEPENDENT_AMBULATORY_CARE_PROVIDER_SITE_OTHER): Payer: Medicare HMO | Admitting: Family Medicine

## 2022-04-28 ENCOUNTER — Encounter: Payer: Self-pay | Admitting: Family Medicine

## 2022-04-28 VITALS — BP 112/57 | HR 60 | Temp 97.4°F | Resp 16 | Ht 70.0 in | Wt 189.0 lb

## 2022-04-28 DIAGNOSIS — D352 Benign neoplasm of pituitary gland: Secondary | ICD-10-CM

## 2022-04-28 DIAGNOSIS — E785 Hyperlipidemia, unspecified: Secondary | ICD-10-CM

## 2022-04-28 DIAGNOSIS — Z0000 Encounter for general adult medical examination without abnormal findings: Secondary | ICD-10-CM | POA: Diagnosis not present

## 2022-04-28 DIAGNOSIS — Z125 Encounter for screening for malignant neoplasm of prostate: Secondary | ICD-10-CM | POA: Diagnosis not present

## 2022-04-28 DIAGNOSIS — G4733 Obstructive sleep apnea (adult) (pediatric): Secondary | ICD-10-CM

## 2022-04-28 LAB — POCT URINALYSIS DIPSTICK
Bilirubin, UA: NEGATIVE
Glucose, UA: NEGATIVE
Ketones, UA: NEGATIVE
Leukocytes, UA: NEGATIVE
Nitrite, UA: NEGATIVE
Protein, UA: NEGATIVE
Spec Grav, UA: >=1.03 — AB (ref 1.010–1.025)
Urobilinogen, UA: 0.2 E.U./dL
pH, UA: 6 (ref 5.0–8.0)

## 2022-04-28 NOTE — Progress Notes (Signed)
I,Roshena L Chambers,acting as a scribe for Wilhemena Durie, MD.,have documented all relevant documentation on the behalf of Wilhemena Durie, MD,as directed by  Wilhemena Durie, MD while in the presence of Wilhemena Durie, MD.   Annual Wellness Visit     Patient: Danny Ortiz, Male    DOB: 07/26/1956, 66 y.o.   MRN: [id number] Visit Date: 04/28/2022  Today's Provider: Wilhemena Durie, MD   Chief Complaint  Patient presents with   Medicare Wellness   Subjective    WINNIE BARSKY is a 66 y.o. male who presents today for his Annual Wellness Visit.Annual Physical. He reports consuming a general diet. The patient does not participate in regular exercise at present. He generally feels fairly well. He reports sleeping fairly well. He does not have additional problems to discuss today.   HPI    Medications: Outpatient Medications Prior to Visit  Medication Sig   omeprazole (PRILOSEC) 20 MG capsule Take 20 mg by mouth daily. prn   sildenafil (REVATIO) 20 MG tablet TAKE 1-5 TABLETS BY MOUTH DAILY AS NEEDED   valACYclovir (VALTREX) 500 MG tablet Take 1 tablet (500 mg total) by mouth 2 (two) times daily.   No facility-administered medications prior to visit.    Allergies  Allergen Reactions   Azithromycin Other (See Comments)    Increased HR   Penicillins Nausea And Vomiting   Prednisone Other (See Comments)    Increased hr   Wellbutrin [Bupropion] Other (See Comments)    Jittery/shakes, dry mouth, felt bad    Patient Care Team: Jerrol Banana., MD as PCP - General (Family Medicine)  Review of Systems  Constitutional:  Negative for appetite change, chills, fatigue and fever.  HENT:  Positive for tinnitus. Negative for congestion, ear pain, hearing loss, nosebleeds and trouble swallowing.   Eyes:  Negative for pain and visual disturbance.  Respiratory:  Negative for cough, chest tightness and shortness of breath.   Cardiovascular:  Negative for  chest pain, palpitations and leg swelling.  Gastrointestinal:  Positive for constipation. Negative for abdominal pain, blood in stool, diarrhea, nausea and vomiting.  Endocrine: Negative for polydipsia, polyphagia and polyuria.  Genitourinary:  Positive for decreased urine volume. Negative for dysuria and flank pain.  Musculoskeletal:  Negative for arthralgias, back pain, joint swelling, myalgias and neck stiffness.  Skin:  Negative for color change, rash and wound.  Neurological:  Negative for dizziness, tremors, seizures, speech difficulty, weakness, light-headedness and headaches.  Psychiatric/Behavioral:  Negative for behavioral problems, confusion, decreased concentration, dysphoric mood and sleep disturbance. The patient is not nervous/anxious.   All other systems reviewed and are negative.        Objective    Vitals: BP (!) 112/57 (BP Location: Left Arm, Patient Position: Sitting, Cuff Size: Large)   Pulse 60   Temp (!) 97.4 F (36.3 C) (Oral)   Resp 16   Ht '5\' 10"'$  (1.778 m)   Wt 189 lb (85.7 kg)   SpO2 99%   BMI 27.12 kg/m  BP Readings from Last 3 Encounters:  04/28/22 (!) 112/57  04/15/22 118/79  03/18/22 101/66   Wt Readings from Last 3 Encounters:  04/28/22 189 lb (85.7 kg)  04/15/22 190 lb 12.8 oz (86.5 kg)  03/18/22 192 lb (87.1 kg)       Physical Exam Vitals reviewed.  Constitutional:      Appearance: He is well-developed.  HENT:     Head: Normocephalic and atraumatic.  Right Ear: External ear normal.     Left Ear: External ear normal.     Nose: Nose normal.  Eyes:     Conjunctiva/sclera: Conjunctivae normal.     Pupils: Pupils are equal, round, and reactive to light.  Cardiovascular:     Rate and Rhythm: Normal rate and regular rhythm.     Heart sounds: Normal heart sounds.  Pulmonary:     Effort: Pulmonary effort is normal.     Breath sounds: Normal breath sounds.  Abdominal:     General: Bowel sounds are normal.     Palpations: Abdomen is  soft.  Genitourinary:    Penis: Normal.      Testes: Normal.  Musculoskeletal:        General: Normal range of motion.     Cervical back: Neck supple.  Skin:    General: Skin is warm and dry.  Neurological:     Mental Status: He is alert and oriented to person, place, and time.  Psychiatric:        Behavior: Behavior normal.        Thought Content: Thought content normal.        Judgment: Judgment normal.      Most recent functional status assessment:    04/28/2022    9:24 AM  In your present state of health, do you have any difficulty performing the following activities:  Hearing? 1  Vision? 0  Difficulty concentrating or making decisions? 0  Walking or climbing stairs? 0  Dressing or bathing? 0  Doing errands, shopping? 0   Most recent fall risk assessment:    04/28/2022    9:24 AM  Fall Risk   Falls in the past year? 0  Number falls in past yr: 0  Injury with Fall? 0  Risk for fall due to : No Fall Risks  Follow up Falls evaluation completed    Most recent depression screenings:    04/28/2022    9:24 AM 10/15/2021    3:02 PM  PHQ 2/9 Scores  PHQ - 2 Score 3 0  PHQ- 9 Score 3 0   Most recent cognitive screening:     No data to display         Most recent Audit-C alcohol use screening    04/28/2022    9:24 AM  Alcohol Use Disorder Test (AUDIT)  1. How often do you have a drink containing alcohol? 2  2. How many drinks containing alcohol do you have on a typical day when you are drinking? 0  3. How often do you have six or more drinks on one occasion? 0  AUDIT-C Score 2   A score of 3 or more in women, and 4 or more in men indicates increased risk for alcohol abuse, EXCEPT if all of the points are from question 1   No results found for any visits on 04/28/22.  Assessment & Plan     Annual wellness visit done today including the all of the following: Reviewed patient's Family Medical History Reviewed and updated list of patient's medical  providers Assessment of cognitive impairment was done Assessed patient's functional ability Established a written schedule for health screening [place] Completed and Reviewed  Exercise Activities and Dietary recommendations  Goals   None     Immunization History  Administered Date(s) Administered   Influenza Whole 06/14/2017   PNEUMOCOCCAL CONJUGATE-20 02/16/2021   Td 09/11/2004   Tdap 10/10/2014, 06/14/2017   Zoster Recombinat (Shingrix)  06/14/2018    Health Maintenance  Topic Date Due   COVID-19 Vaccine (1) Never done   Zoster Vaccines- Shingrix (2 of 2) 08/09/2018   COLONOSCOPY (Pts 45-29yr Insurance coverage will need to be confirmed)  02/15/2022   TETANUS/TDAP  06/15/2027   Pneumonia Vaccine 67 Years old  Completed   Hepatitis C Screening  Completed   HPV VACCINES  Aged Out   INFLUENZA VACCINE  Discontinued     Discussed health benefits of physical activity, and encouraged him to engage in regular exercise appropriate for his age and condition.    1. Encounter for Medicare annual wellness exam  - POCT Urinalysis Dipstick  2. Annual physical exam Patient up-to-date on screenings.  3. Hyperlipidemia, unspecified hyperlipidemia type  - Lipid panel - TSH - CBC w/Diff/Platelet - Comprehensive Metabolic Panel (CMET)  4. Obstructive apnea  - Lipid panel - TSH - CBC w/Diff/Platelet - Comprehensive Metabolic Panel (CMET)  5. Prostate cancer screening  - PSA  6. Prol[place]oma ([place] Followed by endocrinology. - Prolactin   No follow-ups on file.     I, RWilhemena Durie MD, have reviewed all documentation for this visit. The documentation on 05/01/22 for the exam, diagnosis, procedures, and orders are all accurate and complete.    Bronsen Serano GCranford Mon MD  [phone](phone) [phone](fax)  [place]

## 2022-04-29 LAB — CBC WITH DIFFERENTIAL/PLATELET
Basophils Absolute: 0.1 10*3/uL (ref 0.0–0.2)
Basos: 1 %
EOS (ABSOLUTE): 0.2 10*3/uL (ref 0.0–0.4)
Eos: 3 %
Hematocrit: 48.9 % (ref 37.5–51.0)
Hemoglobin: 16.6 g/dL (ref 13.0–17.7)
Immature Grans (Abs): 0 10*3/uL (ref 0.0–0.1)
Immature Granulocytes: 0 %
Lymphocytes Absolute: 1.8 10*3/uL (ref 0.7–3.1)
Lymphs: 23 %
MCH: 30.6 pg (ref 26.6–33.0)
MCHC: 33.9 g/dL (ref 31.5–35.7)
MCV: 90 fL (ref 79–97)
Monocytes Absolute: 0.8 10*3/uL (ref 0.1–0.9)
Monocytes: 10 %
Neutrophils Absolute: 4.9 10*3/uL (ref 1.4–7.0)
Neutrophils: 63 %
Platelets: 256 10*3/uL (ref 150–450)
RBC: 5.43 x10E6/uL (ref 4.14–5.80)
RDW: 12.2 % (ref 11.6–15.4)
WBC: 7.8 10*3/uL (ref 3.4–10.8)

## 2022-04-29 LAB — COMPREHENSIVE METABOLIC PANEL
ALT: 14 IU/L (ref 0–44)
AST: 16 IU/L (ref 0–40)
Albumin/Globulin Ratio: 2.1 (ref 1.2–2.2)
Albumin: 4.6 g/dL (ref 3.9–4.9)
Alkaline Phosphatase: 73 IU/L (ref 44–121)
BUN/Creatinine Ratio: 16 (ref 10–24)
BUN: 17 mg/dL (ref 8–27)
Bilirubin Total: 0.7 mg/dL (ref 0.0–1.2)
CO2: 25 mmol/L (ref 20–29)
Calcium: 9.7 mg/dL (ref 8.6–10.2)
Chloride: 102 mmol/L (ref 96–106)
Creatinine, Ser: 1.09 mg/dL (ref 0.76–1.27)
Globulin, Total: 2.2 g/dL (ref 1.5–4.5)
Glucose: 88 mg/dL (ref 70–99)
Potassium: 4.8 mmol/L (ref 3.5–5.2)
Sodium: 142 mmol/L (ref 134–144)
Total Protein: 6.8 g/dL (ref 6.0–8.5)
eGFR: 74 mL/min/{1.73_m2} (ref >59)

## 2022-04-29 LAB — LIPID PANEL
Chol/HDL Ratio: 6.3 ratio — ABNORMAL HIGH (ref 0.0–5.0)
Cholesterol, Total: 219 mg/dL — ABNORMAL HIGH (ref 100–199)
HDL: 35 mg/dL — ABNORMAL LOW (ref >39)
LDL Chol Calc (NIH): 141 mg/dL — ABNORMAL HIGH (ref 0–99)
Triglycerides: 235 mg/dL — ABNORMAL HIGH (ref 0–149)
VLDL Cholesterol Cal: 43 mg/dL — ABNORMAL HIGH (ref 5–40)

## 2022-04-29 LAB — PROLACTIN: Prolactin: 32.2 ng/mL — ABNORMAL HIGH (ref 4.0–15.2)

## 2022-04-29 LAB — PSA: Prostate Specific Ag, Serum: 1.7 ng/mL (ref 0.0–4.0)

## 2022-04-29 LAB — TSH: TSH: 2.5 u[IU]/mL (ref 0.450–4.500)

## 2022-05-04 ENCOUNTER — Telehealth: Payer: Self-pay

## 2022-05-04 DIAGNOSIS — R319 Hematuria, unspecified: Secondary | ICD-10-CM

## 2022-05-04 NOTE — Telephone Encounter (Signed)
Patient was seen in the office on 04/28/2022 for a AWV with Dr. Rosanna Randy. POCT urinalysis was obtained. Urine sample showed a small amount of blood. Dr. Rosanna Randy would like patient to see if patient can stop by the office to give another urine sample for [place] to send off to lab. Patient does not need to schedule an appointment for this. He can stop by the office any time. Our office closes for lunch 11:30-1pm. Tried calling patient. Left message to call back. OK for [facility] triage to advise.

## 2022-05-04 NOTE — Telephone Encounter (Signed)
Patient called, left VM to return the call to the office for results.

## 2022-05-05 NOTE — Telephone Encounter (Signed)
Patient advised. Patient agrees to stop by the office tomorrow or Friday to give another urine sample.

## 2022-05-12 DIAGNOSIS — R319 Hematuria, unspecified: Secondary | ICD-10-CM | POA: Diagnosis not present

## 2022-05-12 NOTE — Telephone Encounter (Signed)
Patient came by the office today. Urine sample obtained in the office and sent out to the lab for microscopy.

## 2022-05-13 LAB — URINALYSIS, MICROSCOPIC ONLY
Bacteria, UA: NONE SEEN
Casts: NONE SEEN /lpf
Epithelial Cells (non renal): NONE SEEN /hpf (ref 0–10)
RBC, Urine: NONE SEEN /hpf (ref 0–2)

## 2022-05-22 DIAGNOSIS — Z0100 Encounter for examination of eyes and vision without abnormal findings: Secondary | ICD-10-CM | POA: Diagnosis not present

## 2022-06-28 ENCOUNTER — Encounter (INDEPENDENT_AMBULATORY_CARE_PROVIDER_SITE_OTHER): Payer: Self-pay

## 2022-06-29 DIAGNOSIS — K219 Gastro-esophageal reflux disease without esophagitis: Secondary | ICD-10-CM | POA: Diagnosis not present

## 2022-06-29 DIAGNOSIS — Z86010 Personal history of colonic polyps: Secondary | ICD-10-CM | POA: Diagnosis not present

## 2022-06-29 DIAGNOSIS — R1310 Dysphagia, unspecified: Secondary | ICD-10-CM | POA: Diagnosis not present

## 2022-08-26 ENCOUNTER — Telehealth: Payer: Self-pay

## 2022-08-26 ENCOUNTER — Other Ambulatory Visit: Payer: Self-pay | Admitting: Family Medicine

## 2022-08-26 DIAGNOSIS — G8929 Other chronic pain: Secondary | ICD-10-CM | POA: Insufficient documentation

## 2022-08-26 NOTE — Telephone Encounter (Signed)
Copied from [place] [phone]. Topic: Referral - Request for Referral >> Aug 26, 2022  4:23 PM Chapman Fitch wrote: Has patient seen PCP for this complaint? Yes  *If NO, is insurance requiring patient see PCP for this issue before PCP can refer them? Referral for which specialty: Ortho  Preferred provider/office:  Reason for referral: pain in both shoulders

## 2022-08-26 NOTE — Telephone Encounter (Signed)
Patient advised. Verbalized understanding 

## 2022-09-15 DIAGNOSIS — B078 Other viral warts: Secondary | ICD-10-CM | POA: Diagnosis not present

## 2022-09-15 DIAGNOSIS — D485 Neoplasm of uncertain behavior of skin: Secondary | ICD-10-CM | POA: Diagnosis not present

## 2022-09-17 DIAGNOSIS — M7542 Impingement syndrome of left shoulder: Secondary | ICD-10-CM | POA: Diagnosis not present

## 2022-09-17 DIAGNOSIS — M7541 Impingement syndrome of right shoulder: Secondary | ICD-10-CM | POA: Diagnosis not present

## 2022-10-20 ENCOUNTER — Ambulatory Visit: Payer: Medicare HMO | Admitting: Dermatology

## 2022-10-20 VITALS — BP 111/79

## 2022-10-20 DIAGNOSIS — L578 Other skin changes due to chronic exposure to nonionizing radiation: Secondary | ICD-10-CM | POA: Diagnosis not present

## 2022-10-20 DIAGNOSIS — H02834 Dermatochalasis of left upper eyelid: Secondary | ICD-10-CM | POA: Diagnosis not present

## 2022-10-20 DIAGNOSIS — H02831 Dermatochalasis of right upper eyelid: Secondary | ICD-10-CM | POA: Diagnosis not present

## 2022-10-20 DIAGNOSIS — D485 Neoplasm of uncertain behavior of skin: Secondary | ICD-10-CM

## 2022-10-20 DIAGNOSIS — L821 Other seborrheic keratosis: Secondary | ICD-10-CM

## 2022-10-20 DIAGNOSIS — D3617 Benign neoplasm of peripheral nerves and autonomic nervous system of trunk, unspecified: Secondary | ICD-10-CM

## 2022-10-20 DIAGNOSIS — D229 Melanocytic nevi, unspecified: Secondary | ICD-10-CM | POA: Diagnosis not present

## 2022-10-20 DIAGNOSIS — Z7189 Other specified counseling: Secondary | ICD-10-CM | POA: Diagnosis not present

## 2022-10-20 DIAGNOSIS — L814 Other melanin hyperpigmentation: Secondary | ICD-10-CM

## 2022-10-20 DIAGNOSIS — L719 Rosacea, unspecified: Secondary | ICD-10-CM

## 2022-10-20 DIAGNOSIS — I8393 Asymptomatic varicose veins of bilateral lower extremities: Secondary | ICD-10-CM | POA: Diagnosis not present

## 2022-10-20 DIAGNOSIS — Z1283 Encounter for screening for malignant neoplasm of skin: Secondary | ICD-10-CM | POA: Diagnosis not present

## 2022-10-20 DIAGNOSIS — Z79899 Other long term (current) drug therapy: Secondary | ICD-10-CM

## 2022-10-20 DIAGNOSIS — D1801 Hemangioma of skin and subcutaneous tissue: Secondary | ICD-10-CM

## 2022-10-20 MED ORDER — DOXYCYCLINE 40 MG PO CPDR: 40.0000 mg | DELAYED_RELEASE_CAPSULE | ORAL | 6 refills | Status: DC

## 2022-10-20 NOTE — Progress Notes (Signed)
New Patient Visit  Subjective  Danny Ortiz is a 67 y.o. male who presents for the following: Annual Exam (No history of skin cancer or abnormal moles - The patient presents for Total-Body Skin Exam (TBSE) for skin cancer screening and mole check.  The patient has spots, moles and lesions to be evaluated, some may be new or changing and the patient has concerns that these could be cancer./).  The following portions of the chart were reviewed this encounter and updated as appropriate:   Tobacco  Allergies  Meds  Problems  Med Hx  Surg Hx  Fam Hx     Review of Systems:  No other skin or systemic complaints except as noted in HPI or Assessment and Plan.  Objective  Well appearing patient in no apparent distress; mood and affect are within normal limits.  A full examination was performed including scalp, head, eyes, ears, nose, lips, neck, chest, axillae, abdomen, back, buttocks, bilateral upper extremities, bilateral lower extremities, hands, feet, fingers, toes, fingernails, and toenails. All findings within normal limits unless otherwise noted below.  Right mid back paraspinal 0.4 cm brown macule  Face Erythema of nose, cheeks and sclera of eyes  Legs Spider veins   Assessment & Plan   Lentigines - Scattered tan macules - Due to sun exposure - Benign-appearing, observe - Recommend daily broad spectrum sunscreen SPF 30+ to sun-exposed areas, reapply every 2 hours as needed. - Call for any changes  Seborrheic Keratoses - Stuck-on, waxy, tan-brown papules and/or plaques  - Benign-appearing - Discussed benign etiology and prognosis. - Observe - Call for any changes  Melanocytic Nevi - Tan-brown and/or pink-flesh-colored symmetric macules and papules - Benign appearing on exam today - Observation - Call clinic for new or changing moles - Recommend daily use of broad spectrum spf 30+ sunscreen to sun-exposed areas.   Hemangiomas - Red papules - Discussed  benign nature - Observe - Call for any changes  Actinic Damage - Chronic condition, secondary to cumulative UV/sun exposure - diffuse scaly erythematous macules with underlying dyspigmentation - Recommend daily broad spectrum sunscreen SPF 30+ to sun-exposed areas, reapply every 2 hours as needed.  - Staying in the shade or wearing long sleeves, sun glasses (UVA+UVB protection) and wide brim hats (4-inch brim around the entire circumference of the hat) are also recommended for sun protection.  - Call for new or changing lesions.  Skin cancer screening performed today.  Neoplasm of uncertain behavior of skin Right mid back paraspinal Epidermal / dermal shaving  Lesion diameter (cm):  0.4 Informed consent: discussed and consent obtained   Timeout: patient name, date of birth, surgical site, and procedure verified   Procedure prep:  Patient was prepped and draped in usual sterile fashion Prep type:  Isopropyl alcohol Anesthesia: the lesion was anesthetized in a standard fashion   Anesthetic:  1% lidocaine w/ epinephrine 1-100,000 buffered w/ 8.4% NaHCO3 Instrument used: flexible razor blade   Hemostasis achieved with: pressure, aluminum chloride and electrodesiccation   Outcome: patient tolerated procedure well   Post-procedure details: sterile dressing applied and wound care instructions given   Dressing type: bandage and petrolatum    Specimen 1 - Surgical pathology Differential Diagnosis: Irritated nevus R/O dysplasia Check Margins: No  Rosacea Face With Ocular Rosacea Rosacea is a chronic progressive skin condition usually affecting the face of adults, causing redness and/or acne bumps. It is treatable but not curable. It sometimes affects the eyes (ocular rosacea) as well. It may respond  to topical and/or systemic medication and can flare with stress, sun exposure, alcohol, exercise, topical steroids (including hydrocortisone/cortisone 10) and some foods.  Daily application of  broad spectrum spf 30+ sunscreen to face is recommended to reduce flares.  Start Doxycycline 40 mg  1 po qd with food and plenty of fluid  doxycycline (ORACEA) 40 MG capsule - Face Take 1 capsule (40 mg total) by mouth every morning. With food and plenty of fluid  Dermatochalasis of both upper eyelids Left Upper Eyelid Discussed evaluation with ophthalmologist for consideration for visual field testing and possible blepharoplasty  Spider veins of both lower extremities Legs Discussed sclerotherapy for spider vein treatment.  Discussed that it is a cosmetic procedure and is not covered by insurance ($350/treatment).  Treatment consists of injecting the spider veins with a medicine called Asclera to help them disappear.  Multiple treatments are generally necessary to get best results.  Risks including bruising and persistent discoloration due to post-inflammatory hyperpigmentation.  Sclerotherapy does not prevent the development of new spider veins.  Daily compression hose for two weeks after procedure is recommended.   Return in about 1 year (around 10/21/2023) for TBSE.  I, Ashok Cordia, CMA, am acting as scribe for Sarina Ser, MD . Documentation: I have reviewed the above documentation for accuracy and completeness, and I agree with the above.  Sarina Ser, MD

## 2022-10-20 NOTE — Patient Instructions (Addendum)
Rosacea is a chronic progressive skin condition usually affecting the face of adults, causing redness and/or acne bumps. It is treatable but not curable. It sometimes affects the eyes (ocular rosacea) as well. It may respond to topical and/or systemic medication and can flare with stress, sun exposure, alcohol, exercise, topical steroids (including hydrocortisone/cortisone 10) and some foods.  Daily application of broad spectrum spf 30+ sunscreen to face is recommended to reduce flares. May consider treatment with Doxycycline 40 mg in the future.      Shave Excision Benign Lesion Wound Care Instructions  Leave the original bandage on for 24 hours if possible.  If the bandage becomes soaked or soiled before that time, it is OK to remove it and examine the wound.  A small amount of post-operative bleeding is normal.  If excessive bleeding occurs, remove the bandage, place gauze over the site and apply continuous pressure (no peeking) over the area for 20-30 minutes.  If this does not stop the bleeding, try again for 40 minutes.  If this does not work, please call our clinic as soon as possible (even if after-hours).    Twice a day, cleanse the wound with soap and water.  If a thick crust develops you may use a Q-tip dipped into dilute hydrogen peroxide (mix 1:1 with water) to dissolve it.  Hydrogen peroxide can slow the healing process, so use it only as needed.  After washing, apply Vaseline jelly or Polysporin ointment.  For best healing, the wound should be covered with a layer of ointment at all times.  This may mean re-applying the ointment several times a day.  For open wounds, continue until it has healed.    If you have any swelling, keep the area elevated.  Some redness, tenderness and white or yellow material in the wound is normal healing.  If the area becomes very sore and red, or develops a thick yellow-green material (pus), it may be infected; please notify us.    Wound healing continues  for up to one year following surgery.  It is not unusual to experience pain in the scar from time to time during the interval.  If the pain becomes severe or the scar thickens, you should notify the office.  A slight amount of redness in a scar is expected for the first six months.  After six months, the redness subsides and the scar will soften and fade.  The color difference becomes less noticeable with time.  If there are any problems, return for a post-op surgery check at your earliest convenience.  Please call our office for any questions or concerns.   Due to recent changes in healthcare laws, you may see results of your pathology and/or laboratory studies on MyChart before the doctors have had a chance to review them. We understand that in some cases there may be results that are confusing or concerning to you. Please understand that not all results are received at the same time and often the doctors may need to interpret multiple results in order to provide you with the best plan of care or course of treatment. Therefore, we ask that you please give [place] 2 business days to thoroughly review all your results before contacting the office for clarification. Should we see a critical lab result, you will be contacted sooner.   If You Need Anything After Your Visit  If you have any questions or concerns for your doctor, please call our main line at [phone] and press option  4 to reach your doctor's medical assistant. If no one answers, please leave a voicemail as directed and we will return your call as soon as possible. Messages left after 4 pm will be answered the following business day.   You may also send [place] a message via [place]. We typically respond to MyChart messages within 1-2 business days.  For prescription refills, please ask your pharmacy to contact our office. Our fax number is [phone].  If you have an urgent issue when the clinic is closed that cannot wait until the next  business day, you can page your doctor at the number below.    Please note that while we do our best to be available for urgent issues outside of office hours, we are not available 24/7.   If you have an urgent issue and are unable to reach [place], you may choose to seek medical care at your doctor's office, retail clinic, urgent care center, or emergency room.  If you have a medical emergency, please immediately call 911 or go to the emergency department.  Pager Numbers  - Dr. Nehemiah Massed: [phone]  - Dr. Laurence Ferrari: [phone]  - Dr. Nicole Kindred: [phone]  In the event of inclement weather, please call our main line at [phone] for an update on the status of any delays or closures.  Dermatology Medication Tips: Please keep the boxes that topical medications come in in order to help keep track of the instructions about where and how to use these. Pharmacies typically print the medication instructions only on the boxes and not directly on the medication tubes.   If your medication is too expensive, please contact our office at [phone] option 4 or send [place] a message through [place].   We are unable to tell what your co-pay for medications will be in advance as this is different depending on your insurance coverage. However, we may be able to find a substitute medication at lower cost or fill out paperwork to get insurance to cover a needed medication.   If a prior authorization is required to get your medication covered by your insurance company, please allow [place] 1-2 business days to complete this process.  Drug prices often vary depending on where the prescription is filled and some pharmacies may offer cheaper prices.  The website www.goodrx.com contains coupons for medications through different pharmacies. The prices here do not account for what the cost may be with help from insurance (it may be cheaper with your insurance), but the website can give you the price if you did not use  any insurance.  - You can print the associated coupon and take it with your prescription to the pharmacy.  - You may also stop by our office during regular business hours and pick up a GoodRx coupon card.  - If you need your prescription sent electronically to a different pharmacy, notify our office through [facility] or by phone at [phone] option 4.     Si Usted Necesita Algo Despus de Su Visita  Tambin puede enviarnos un mensaje a travs de Pharmacist, community. Por lo general respondemos a los mensajes de MyChart en el transcurso de 1 a 2 das hbiles.  Para renovar recetas, por favor pida a su farmacia que se ponga en contacto con nuestra oficina. Harland Dingwall de fax es [place] [phone].  Si tiene un asunto urgente cuando la clnica est cerrada y que no puede esperar hasta el siguiente da hbil, puede llamar/localizar a su doctor(a) al nmero que aparece  a continuacin.   Por favor, tenga en cuenta que aunque hacemos todo lo posible para estar disponibles para asuntos urgentes fuera del horario de [place], no estamos disponibles las 24 horas del da, los 7 das de la [place].   Si tiene un problema urgente y no puede comunicarse con nosotros, puede optar por buscar atencin mdica  en el consultorio de su doctor(a), en una clnica privada, en un centro de atencin urgente o en una sala de emergencias.  Si tiene Engineering geologist, por favor llame inmediatamente al 911 o vaya a la sala de emergencias.  Nmeros de bper  - Dr. Nehemiah Massed: [phone]  - Dra. Moye: [phone]  - Dra. Nicole Kindred: [phone]  En caso de inclemencias del [place], por favor llame a Johnsie Kindred principal al [phone] para una actualizacin sobre el [place] de cualquier retraso o cierre.  Consejos para la medicacin en dermatologa: Por favor, guarde las cajas en las que vienen los medicamentos de uso tpico para ayudarle a seguir las instrucciones sobre dnde y cmo usarlos. Las farmacias  generalmente imprimen las instrucciones del medicamento slo en las cajas y no directamente en los tubos del [place].   Si su medicamento es muy caro, por favor, pngase en contacto con Zigmund Daniel llamando al [phone] y presione la opcin 4 o envenos un mensaje a travs de Pharmacist, community.   No podemos decirle cul ser su copago por los medicamentos por adelantado ya que esto es diferente dependiendo de la cobertura de su seguro. Sin embargo, es posible que podamos encontrar un medicamento sustituto a Electrical engineer un formulario para que el seguro cubra el medicamento que se considera necesario.   Si se requiere una autorizacin previa para que su compaa de seguros [place] su medicamento, por favor permtanos de 1 a 2 das hbiles para completar este proceso.  Los precios de los medicamentos varan con frecuencia dependiendo del Environmental consultant de dnde se surte la receta y alguna farmacias pueden ofrecer precios ms baratos.  El sitio web www.goodrx.com tiene cupones para medicamentos de Airline pilot. Los precios aqu no tienen en cuenta lo que podra costar con la ayuda del seguro (puede ser ms barato con su seguro), pero el sitio web puede darle el precio si no utiliz Research scientist (physical sciences).  - Puede imprimir el cupn correspondiente y llevarlo con su receta a la farmacia.  - Tambin puede pasar por nuestra oficina durante el horario de atencin regular y Charity fundraiser una tarjeta de cupones de GoodRx.  - Si necesita que su receta se enve electrnicamente a una farmacia diferente, informe a nuestra oficina a travs de MyChart de [place] o por telfono llamando al [phone] y presione la opcin 4.

## 2022-10-25 ENCOUNTER — Encounter: Payer: Self-pay | Admitting: Dermatology

## 2022-10-25 ENCOUNTER — Telehealth: Payer: Self-pay

## 2022-10-25 NOTE — Telephone Encounter (Signed)
Left pt msg to call for bx result/sh °

## 2022-10-25 NOTE — Telephone Encounter (Signed)
-----   Message from Ralene Bathe, MD sent at 10/24/2022  6:43 PM EST ----- Diagnosis Skin , right mid back paraspinal NEUROFIBROMA, BASE INVOLVED  Benign Neurofibroma = "mole" No further treatment needed

## 2022-11-29 DIAGNOSIS — H2513 Age-related nuclear cataract, bilateral: Secondary | ICD-10-CM | POA: Diagnosis not present

## 2022-11-29 DIAGNOSIS — H43813 Vitreous degeneration, bilateral: Secondary | ICD-10-CM | POA: Diagnosis not present

## 2022-11-30 ENCOUNTER — Telehealth: Payer: Self-pay

## 2022-11-30 NOTE — Telephone Encounter (Signed)
Patient informed of pathology results 

## 2022-11-30 NOTE — Telephone Encounter (Signed)
-----   Message from David C Kowalski, MD sent at 10/24/2022  6:43 PM EST ----- Diagnosis Skin , right mid back paraspinal NEUROFIBROMA, BASE INVOLVED  Benign Neurofibroma = "mole" No further treatment needed 

## 2023-01-29 DIAGNOSIS — M26621 Arthralgia of right temporomandibular joint: Secondary | ICD-10-CM | POA: Diagnosis not present

## 2023-01-29 DIAGNOSIS — S6991XA Unspecified injury of right wrist, hand and finger(s), initial encounter: Secondary | ICD-10-CM | POA: Diagnosis not present

## 2023-01-31 ENCOUNTER — Ambulatory Visit (INDEPENDENT_AMBULATORY_CARE_PROVIDER_SITE_OTHER): Payer: Medicare HMO

## 2023-01-31 VITALS — Ht 70.0 in | Wt 189.0 lb

## 2023-01-31 DIAGNOSIS — Z1211 Encounter for screening for malignant neoplasm of colon: Secondary | ICD-10-CM

## 2023-01-31 DIAGNOSIS — Z0000 Encounter for general adult medical examination without abnormal findings: Secondary | ICD-10-CM | POA: Diagnosis not present

## 2023-01-31 NOTE — Progress Notes (Signed)
I connected with  [name] on 01/31/23 by a audio enabled telemedicine application and verified that I am speaking with the correct person using two identifiers.  Patient Location: Home  Provider Location: Office/Clinic  I discussed the limitations of evaluation and management by telemedicine. The patient expressed understanding and agreed to proceed.  Subjective:   Danny Ortiz is a 67 y.o. male who presents for Medicare Annual/Subsequent preventive examination.  Review of Systems    Cardiac Risk Factors include: advanced age (>41men, >84 women);dyslipidemia;male gender    Objective:    Today's Vitals   01/31/23 1538 01/31/23 1539  Weight: 189 lb (85.7 kg)   Height: 5\' 10"  (1.778 m)   PainSc:  5    Body mass index is 27.12 kg/m.     01/31/2023    3:51 PM 12/30/2016    9:13 AM 02/14/2015   11:30 AM  Advanced Directives  Does Patient Have a Medical Advance Directive? No No No    Current Medications (verified) Outpatient Encounter Medications as of 01/31/2023  Medication Sig   omeprazole (PRILOSEC) 20 MG capsule Take 20 mg by mouth daily. prn   predniSONE (DELTASONE) 20 MG tablet Take 1 tablet twice daily for 5 days with food, then 1 tablet once daily for 5 days with food.   sildenafil (REVATIO) 20 MG tablet TAKE 1-5 TABLETS BY MOUTH DAILY AS NEEDED   valACYclovir (VALTREX) 500 MG tablet Take 1 tablet (500 mg total) by mouth 2 (two) times daily.   doxycycline (ORACEA) 40 MG capsule Take 1 capsule (40 mg total) by mouth every morning. With food and plenty of fluid (Patient not taking: Reported on 01/31/2023)   No facility-administered encounter medications on file as of 01/31/2023.    Allergies (verified) Azithromycin, Penicillins, Prednisone, and Wellbutrin [bupropion]   History: Past Medical History:  Diagnosis Date   Balanitis    Bronchitis    Heartburn    Sleep apnea    Past Surgical History:  Procedure Laterality Date   TONSILLECTOMY AND ADENOIDECTOMY      Family History  Problem Relation Age of Onset   Heart disease Mother    Lymphoma Mother    Colon polyps Maternal Grandmother    Prostate cancer Maternal Grandfather    Kidney disease Neg Hx    Social History   Socioeconomic History   Marital status: Married    Spouse name: Not on file   Number of children: Not on file   Years of education: Not on file   Highest education level: Not on file  Occupational History   Not on file  Tobacco Use   Smoking status: Never   Smokeless tobacco: Never  Vaping Use   Vaping Use: Never used  Substance and Sexual Activity   Alcohol use: Yes    Alcohol/week: 0.0 standard drinks of alcohol    Comment: OCCASIONALLY   Drug use: No   Sexual activity: Never  Other Topics Concern   Not on file  Social History Narrative   Not on file   Social Determinants of Health   Financial Resource Strain: Low Risk  (01/31/2023)   Overall Financial Resource Strain (CARDIA)    Difficulty of Paying Living Expenses: Not hard at all  Food Insecurity: No Food Insecurity (01/31/2023)   Hunger Vital Sign    Worried About Running Out of Food in the Last Year: Never true    Ran Out of Food in the Last Year: Never true  Transportation Needs: No Transportation  Needs (01/31/2023)   PRAPARE - Administrator, Civil Service (Medical): No    Lack of Transportation (Non-Medical): No  Physical Activity: Inactive (01/31/2023)   Exercise Vital Sign    Days of Exercise per Week: 0 days    Minutes of Exercise per Session: 0 min  Stress: No Stress Concern Present (01/31/2023)   [employer] of Occupational Health - Occupational Stress Questionnaire    Feeling of Stress : Not at all  Social Connections: Moderately Integrated (01/31/2023)   Social Connection and Isolation Panel [NHANES]    Frequency of Communication with Friends and Family: Three times a week    Frequency of Social Gatherings with Friends and Family: Three times a week    Attends Religious  Services: Never    Active Member of Clubs or Organizations: Yes    Attends Banker Meetings: 1 to 4 times per year    Marital Status: Married    Tobacco Counseling Counseling given: Not Answered   Clinical Intake:  Pre-visit preparation completed: Yes  Pain : 0-10 Pain Score: 5  Pain Type: Acute pain Pain Location: Wrist Pain Orientation: Right Pain Descriptors / Indicators: Aching, Sharp Pain Onset: In the past 7 days Pain Frequency: Intermittent Pain Relieving Factors: IBU, brace  Pain Relieving Factors: IBU, brace  BMI - recorded: 27.12 Nutritional Status: BMI 25 -29 Overweight Nutritional Risks: None Diabetes: No  How often do you need to have someone help you when you read instructions, pamphlets, or other written materials from your doctor or pharmacy?: 1 - Never  Diabetic?no  Interpreter Needed?: No  Comments: lives with wife Information entered by :: B.Grady Lucci,LPN   Activities of Daily Living    01/31/2023    3:51 PM 04/28/2022    9:24 AM  In your present state of health, do you have any difficulty performing the following activities:  Hearing? 1 1  Vision? 0 0  Difficulty concentrating or making decisions? 0 0  Walking or climbing stairs? 0 0  Dressing or bathing? 0 0  Doing errands, shopping? 0 0  Preparing Food and eating ? N   Using the Toilet? N   In the past six months, have you accidently leaked urine? N   Do you have problems with loss of bowel control? N   Managing your Medications? N   Managing your Finances? N   Housekeeping or managing your Housekeeping? N     Patient Care Team: Malva Limes, MD as PCP - General (Family Medicine)  Indicate any recent Medical Services you may have received from other than [facility] providers in the past year (date may be approximate).     Assessment:   This is a routine wellness examination for [facility].  Hearing/Vision screen Hearing Screening - Comments:: Adequate hearing Vision  Screening - Comments:: Adequate vision w/glasses [facility]-going to Dr Clydene Pugh  Dietary issues and exercise activities discussed: Current Exercise Habits: The patient has a physically strenuous job, but has no regular exercise apart from work., Exercise limited by: neurologic condition(s)   Goals Addressed   None    Depression Screen    01/31/2023    3:47 PM 04/28/2022    9:24 AM 10/15/2021    3:02 PM 07/23/2020   10:49 AM 11/12/2019   11:32 AM 01/12/2018   10:11 AM 12/30/2016    9:16 AM  PHQ 2/9 Scores  PHQ - 2 Score 1 3 0 3 0 0 0  PHQ- 9 Score  3 0 3  0 0 1    Fall Risk    01/31/2023    3:44 PM 04/28/2022    9:24 AM 10/15/2021    3:02 PM 07/23/2020   10:48 AM 11/12/2019   11:31 AM  Fall Risk   Falls in the past year? 1 0 0 0 0  Number falls in past yr: 0 0  0 0  Injury with Fall? 0 0  0 0  Risk for fall due to : No Fall Risks No Fall Risks  No Fall Risks   Follow up Education provided;Falls prevention discussed Falls evaluation completed  Falls evaluation completed Falls evaluation completed    FALL RISK PREVENTION PERTAINING TO THE HOME:  Any stairs in or around the home? Yes  If so, are there any without handrails? Yes  Home free of loose throw rugs in walkways, pet beds, electrical cords, etc? Yes  Adequate lighting in your home to reduce risk of falls? Yes   ASSISTIVE DEVICES UTILIZED TO PREVENT FALLS:  Life alert? No  Use of a cane, walker or w/c? No  Grab bars in the bathroom? No  Shower chair or bench in shower? No  Elevated toilet seat or a handicapped toilet? Yes     Cognitive Function:        01/31/2023    4:00 PM  6CIT Screen  What Year? 0 points  What month? 0 points  What time? 0 points  Count back from 20 0 points  Months in reverse 0 points  Repeat phrase 2 points  Total Score 2 points    Immunizations Immunization History  Administered Date(s) Administered   Influenza Whole 06/14/2017   PNEUMOCOCCAL CONJUGATE-20 02/16/2021   Td  09/11/2004   Tdap 10/10/2014, 06/14/2017   Zoster Recombinat (Shingrix) 06/14/2018    TDAP status: Up to date  Flu Vaccine status: Declined, Education has been provided regarding the importance of this vaccine but patient still declined. Advised may receive this vaccine at local pharmacy or Health Dept. Aware to provide a copy of the vaccination record if obtained from local pharmacy or Health Dept. Verbalized acceptance and understanding.  Pneumococcal vaccine status: Up to date  Covid-19 vaccine status: Declined, Education has been provided regarding the importance of this vaccine but patient still declined. Advised may receive this vaccine at local pharmacy or Health Dept.or vaccine clinic. Aware to provide a copy of the vaccination record if obtained from local pharmacy or Health Dept. Verbalized acceptance and understanding.  Qualifies for Shingles Vaccine? Yes   Zostavax completed Yes   Shingrix Completed?: Yes  Screening Tests Health Maintenance  Topic Date Due   COVID-19 Vaccine (1) Never done   Zoster Vaccines- Shingrix (2 of 2) 08/09/2018   Colonoscopy  02/15/2022   Medicare Annual Wellness (AWV)  01/31/2024   DTaP/Tdap/Td (4 - Td or Tdap) 06/15/2027   Pneumonia Vaccine 45+ Years old  Completed   Hepatitis C Screening  Completed   HPV VACCINES  Aged Out   INFLUENZA VACCINE  Discontinued    Health Maintenance  Health Maintenance Due  Topic Date Due   COVID-19 Vaccine (1) Never done   Zoster Vaccines- Shingrix (2 of 2) 08/09/2018   Colonoscopy  02/15/2022    Colorectal cancer screening: Referral to GI placed yes. Pt aware the office will call re: appt.  Lung Cancer Screening: (Low Dose CT Chest recommended if Age 96-80 years, 30 pack-year currently smoking OR have quit w/in 15years.) does not qualify.   Lung Cancer Screening Referral:  no  Additional Screening:  Hepatitis C Screening: does not qualify; Completed yes  Vision Screening: Recommended annual  ophthalmology exams for early detection of glaucoma and other disorders of the eye. Is the patient up to date with their annual eye exam?  Yes  Who is the provider or what is the name of the office in which the patient attends annual eye exams? Dr Clydene Pugh If pt is not established with a provider, would they like to be referred to a provider to establish care? No .   Dental Screening: Recommended annual dental exams for proper oral hygiene  Community Resource Referral / Chronic Care Management: CRR required this visit?  No   CCM required this visit?  No     Plan:     I have personally reviewed and noted the following in the patient's chart:   Medical and social history Use of alcohol, tobacco or illicit drugs  Current medications and supplements including opioid prescriptions. Patient is not currently taking opioid prescriptions. Functional ability and status Nutritional status Physical activity Advanced directives List of other physicians Hospitalizations, surgeries, and ER visits in previous 12 months Vitals Screenings to include cognitive, depression, and falls Referrals and appointments  In addition, I have reviewed and discussed with patient certain preventive protocols, quality metrics, and best practice recommendations. A written personalized care plan for preventive services as well as general preventive health recommendations were provided to patient.     Sue Lush, LPN   03/07/2955   Nurse Notes: pt states he is having a bout with rt wrist pain from working on the deck on Saturday. He was seen at Urgent Care and is on round of Prednisone. He does relay his need to see an ENT provider as he says he has nasal congestion everyday. Pt says he has seen Dr Willeen Cass in the past but will see whomever recommended.  *Referral for colonoscopy made

## 2023-01-31 NOTE — Patient Instructions (Addendum)
[name] , Thank you for taking time to come for your Medicare Wellness Visit. I appreciate your ongoing commitment to your health goals. Please review the following plan we discussed and let me know if I can assist you in the future.   These are the goals we discussed:  Goals   None     This is a list of the screening recommended for you and due dates:  Health Maintenance  Topic Date Due   COVID-19 Vaccine (1) Never done   Zoster (Shingles) Vaccine (2 of 2) 08/09/2018   Colon Cancer Screening  02/15/2022   Medicare Annual Wellness Visit  01/31/2024   DTaP/Tdap/Td vaccine (4 - Td or Tdap) 06/15/2027   Pneumonia Vaccine  Completed   Hepatitis C Screening  Completed   HPV Vaccine  Aged Out   Flu Shot  Discontinued    Advanced directives: no  Conditions/risks identified: none  Next appointment: Follow up in one year for your annual wellness visit. 02/01/2024  3pm telephone  Preventive Care 65 Years and Older, Male  Preventive care refers to lifestyle choices and visits with your health care provider that can promote health and wellness. What does preventive care include? A yearly physical exam. This is also called an annual well check. Dental exams once or twice a year. Routine eye exams. Ask your health care provider how often you should have your eyes checked. Personal lifestyle choices, including: Daily care of your teeth and gums. Regular physical activity. Eating a healthy diet. Avoiding tobacco and drug use. Limiting alcohol use. Practicing safe sex. Taking low doses of aspirin every day. Taking vitamin and mineral supplements as recommended by your health care provider. What happens during an annual well check? The services and screenings done by your health care provider during your annual well check will depend on your age, overall health, lifestyle risk factors, and family history of disease. Counseling  Your health care provider may ask you questions about  your: Alcohol use. Tobacco use. Drug use. Emotional well-being. Home and relationship well-being. Sexual activity. Eating habits. History of falls. Memory and ability to understand (cognition). Work and work Astronomer. Screening  You may have the following tests or measurements: Height, weight, and BMI. Blood pressure. Lipid and cholesterol levels. These may be checked every 5 years, or more frequently if you are over 18 years old. Skin check. Lung cancer screening. You may have this screening every year starting at age 48 if you have a 30-pack-year history of smoking and currently smoke or have quit within the past 15 years. Fecal occult blood test (FOBT) of the stool. You may have this test every year starting at age 60. Flexible sigmoidoscopy or colonoscopy. You may have a sigmoidoscopy every 5 years or a colonoscopy every 10 years starting at age 93. Prostate cancer screening. Recommendations will vary depending on your family history and other risks. Hepatitis C blood test. Hepatitis B blood test. Sexually transmitted disease (STD) testing. Diabetes screening. This is done by checking your blood sugar (glucose) after you have not eaten for a while (fasting). You may have this done every 1-3 years. Abdominal aortic aneurysm (AAA) screening. You may need this if you are a current or former smoker. Osteoporosis. You may be screened starting at age 32 if you are at high risk. Talk with your health care provider about your test results, treatment options, and if necessary, the need for more tests. Vaccines  Your health care provider may recommend certain vaccines, such  as: Influenza vaccine. This is recommended every year. Tetanus, diphtheria, and acellular pertussis (Tdap, Td) vaccine. You may need a Td booster every 10 years. Zoster vaccine. You may need this after age 23. Pneumococcal 13-valent conjugate (PCV13) vaccine. One dose is recommended after age 10. Pneumococcal  polysaccharide (PPSV23) vaccine. One dose is recommended after age 37. Talk to your health care provider about which screenings and vaccines you need and how often you need them. This information is not intended to replace advice given to you by your health care provider. Make sure you discuss any questions you have with your health care provider. Document Released: 09/12/2015 Document Revised: 05/05/2016 Document Reviewed: 06/17/2015 Elsevier Interactive Patient Education  2017 [employer].  Fall Prevention in the Home Falls can cause injuries. They can happen to people of all ages. There are many things you can do to make your home safe and to help prevent falls. What can I do on the outside of my home? Regularly fix the edges of walkways and driveways and fix any cracks. Remove anything that might make you trip as you walk through a door, such as a raised step or threshold. Trim any bushes or trees on the path to your home. Use bright outdoor lighting. Clear any walking paths of anything that might make someone trip, such as rocks or tools. Regularly check to see if handrails are loose or broken. Make sure that both sides of any steps have handrails. Any raised decks and porches should have guardrails on the edges. Have any leaves, snow, or ice cleared regularly. Use sand or salt on walking paths during winter. Clean up any spills in your garage right away. This includes oil or grease spills. What can I do in the bathroom? Use night lights. Install grab bars by the toilet and in the tub and shower. Do not use towel bars as grab bars. Use non-skid mats or decals in the tub or shower. If you need to sit down in the shower, use a plastic, non-slip stool. Keep the floor dry. Clean up any water that spills on the floor as soon as it happens. Remove soap buildup in the tub or shower regularly. Attach bath mats securely with double-sided non-slip rug tape. Do not have throw rugs and other  things on the floor that can make you trip. What can I do in the bedroom? Use night lights. Make sure that you have a light by your bed that is easy to reach. Do not use any sheets or blankets that are too big for your bed. They should not hang down onto the floor. Have a firm chair that has side arms. You can use this for support while you get dressed. Do not have throw rugs and other things on the floor that can make you trip. What can I do in the kitchen? Clean up any spills right away. Avoid walking on wet floors. Keep items that you use a lot in easy-to-reach places. If you need to reach something above you, use a strong step stool that has a grab bar. Keep electrical cords out of the way. Do not use floor polish or wax that makes floors slippery. If you must use wax, use non-skid floor wax. Do not have throw rugs and other things on the floor that can make you trip. What can I do with my stairs? Do not leave any items on the stairs. Make sure that there are handrails on both sides of the stairs and use  them. Fix handrails that are broken or loose. Make sure that handrails are as long as the stairways. Check any carpeting to make sure that it is firmly attached to the stairs. Fix any carpet that is loose or worn. Avoid having throw rugs at the top or bottom of the stairs. If you do have throw rugs, attach them to the floor with carpet tape. Make sure that you have a light switch at the top of the stairs and the bottom of the stairs. If you do not have them, ask someone to add them for you. What else can I do to help prevent falls? Wear shoes that: Do not have high heels. Have rubber bottoms. Are comfortable and fit you well. Are closed at the toe. Do not wear sandals. If you use a stepladder: Make sure that it is fully opened. Do not climb a closed stepladder. Make sure that both sides of the stepladder are locked into place. Ask someone to hold it for you, if possible. Clearly  mark and make sure that you can see: Any grab bars or handrails. First and last steps. Where the edge of each step is. Use tools that help you move around (mobility aids) if they are needed. These include: Canes. Walkers. Scooters. Crutches. Turn on the lights when you go into a dark area. Replace any light bulbs as soon as they burn out. Set up your furniture so you have a clear path. Avoid moving your furniture around. If any of your floors are uneven, fix them. If there are any pets around you, be aware of where they are. Review your medicines with your doctor. Some medicines can make you feel dizzy. This can increase your chance of falling. Ask your doctor what other things that you can do to help prevent falls. This information is not intended to replace advice given to you by your health care provider. Make sure you discuss any questions you have with your health care provider. Document Released: 06/12/2009 Document Revised: 01/22/2016 Document Reviewed: 09/20/2014 Elsevier Interactive Patient Education  2017 [employer].

## 2023-02-03 DIAGNOSIS — H43393 Other vitreous opacities, bilateral: Secondary | ICD-10-CM | POA: Diagnosis not present

## 2023-02-03 DIAGNOSIS — H5213 Myopia, bilateral: Secondary | ICD-10-CM | POA: Diagnosis not present

## 2023-02-26 DIAGNOSIS — J019 Acute sinusitis, unspecified: Secondary | ICD-10-CM | POA: Diagnosis not present

## 2023-02-26 DIAGNOSIS — Z03818 Encounter for observation for suspected exposure to other biological agents ruled out: Secondary | ICD-10-CM | POA: Diagnosis not present

## 2023-02-26 DIAGNOSIS — J069 Acute upper respiratory infection, unspecified: Secondary | ICD-10-CM | POA: Diagnosis not present

## 2023-03-02 DIAGNOSIS — J019 Acute sinusitis, unspecified: Secondary | ICD-10-CM | POA: Diagnosis not present

## 2023-03-02 DIAGNOSIS — B9689 Other specified bacterial agents as the cause of diseases classified elsewhere: Secondary | ICD-10-CM | POA: Diagnosis not present

## 2023-04-06 DIAGNOSIS — H6123 Impacted cerumen, bilateral: Secondary | ICD-10-CM | POA: Diagnosis not present

## 2023-04-06 DIAGNOSIS — H903 Sensorineural hearing loss, bilateral: Secondary | ICD-10-CM | POA: Diagnosis not present

## 2023-04-06 DIAGNOSIS — J301 Allergic rhinitis due to pollen: Secondary | ICD-10-CM | POA: Diagnosis not present

## 2023-04-06 DIAGNOSIS — H9313 Tinnitus, bilateral: Secondary | ICD-10-CM | POA: Diagnosis not present

## 2023-04-06 DIAGNOSIS — G4733 Obstructive sleep apnea (adult) (pediatric): Secondary | ICD-10-CM | POA: Diagnosis not present

## 2023-05-06 ENCOUNTER — Ambulatory Visit (INDEPENDENT_AMBULATORY_CARE_PROVIDER_SITE_OTHER): Payer: Medicare HMO | Admitting: Family Medicine

## 2023-05-06 ENCOUNTER — Encounter: Payer: Self-pay | Admitting: Family Medicine

## 2023-05-06 VITALS — BP 102/69 | HR 60 | Temp 97.6°F | Resp 16 | Ht 70.0 in | Wt 190.0 lb

## 2023-05-06 DIAGNOSIS — E221 Hyperprolactinemia: Secondary | ICD-10-CM | POA: Diagnosis not present

## 2023-05-06 DIAGNOSIS — E559 Vitamin D deficiency, unspecified: Secondary | ICD-10-CM

## 2023-05-06 DIAGNOSIS — G4733 Obstructive sleep apnea (adult) (pediatric): Secondary | ICD-10-CM

## 2023-05-06 DIAGNOSIS — K219 Gastro-esophageal reflux disease without esophagitis: Secondary | ICD-10-CM | POA: Diagnosis not present

## 2023-05-06 DIAGNOSIS — Z0000 Encounter for general adult medical examination without abnormal findings: Secondary | ICD-10-CM | POA: Diagnosis not present

## 2023-05-06 DIAGNOSIS — E785 Hyperlipidemia, unspecified: Secondary | ICD-10-CM | POA: Diagnosis not present

## 2023-05-06 DIAGNOSIS — Z125 Encounter for screening for malignant neoplasm of prostate: Secondary | ICD-10-CM

## 2023-05-06 MED ORDER — SILDENAFIL CITRATE 20 MG PO TABS: ORAL_TABLET | ORAL | 5 refills | Status: AC

## 2023-05-06 NOTE — Progress Notes (Signed)
Complete physical exam   Patient: Danny Ortiz   DOB: [birth year]   67 y.o. Male  MRN: [id number] Visit Date: 05/06/2023  Today's healthcare provider: Mila Merry, MD   Chief Complaint  Patient presents with   Annual Exam   Subjective    Discussed the use of AI scribe software for clinical note transcription with the patient, who gave verbal consent to proceed.  History of Present Illness   The patient presents for a routine physical. He expresses concern about his prolactin levels and requests a PSA test due to his family history. He also reports difficulty sleeping, stating that he starts snoring as soon as he falls asleep. He has a history of remotely diagnosed sleep apnea and used a CPAP machine years ago, but found it intolerable. He is open to trying it again due to his current sleep issues. He sates his ENT, Dr. Willeen Cass, is supposed to be scheduling for sleep study.   The patient also reports a popping sensation in his jaw when yawning and has an upcoming appointment to address this. He is scheduled to see a gastroenterologist, presumably for a colonoscopy, in the fall. He expresses concern about potential pituitary gland issues due to his high prolactin levels and low testosterone. He had an MRI of the pituitary gland in 2022 when he was been followed by Dr. Gershon Crane, which was normal.  The patient also reports urinary issues, including slow urination and nocturia. He describes his urine flow as a "dribble" and has to get up every night to urinate. He also expresses concern about potential pancreatic and liver issues, although he does not report any specific symptoms related to these organs.  The patient acknowledges that he does not get enough exercise and occasionally indulges in ice cream, despite having slightly elevated cholesterol levels. He requests a refill of sildenafil and expresses concern about the cost of this medication. He also reports stiffness in various  parts of his body as he ages.       Past Medical History:  Diagnosis Date   Balanitis    Bronchitis    Heartburn    Sleep apnea    Past Surgical History:  Procedure Laterality Date   TONSILLECTOMY AND ADENOIDECTOMY     Social History   Socioeconomic History   Marital status: Married    Spouse name: Not on file   Number of children: Not on file   Years of education: Not on file   Highest education level: Not on file  Occupational History   Not on file  Tobacco Use   Smoking status: Never   Smokeless tobacco: Never  Vaping Use   Vaping status: Never Used  Substance and Sexual Activity   Alcohol use: Yes    Alcohol/week: 0.0 standard drinks of alcohol    Comment: OCCASIONALLY   Drug use: No   Sexual activity: Never  Other Topics Concern   Not on file  Social History Narrative   Not on file   Social Determinants of Health   Financial Resource Strain: Low Risk  (05/06/2023)   Overall Financial Resource Strain (CARDIA)    Difficulty of Paying Living Expenses: Not hard at all  Food Insecurity: No Food Insecurity (05/06/2023)   Hunger Vital Sign    Worried About Running Out of Food in the Last Year: Never true    Ran Out of Food in the Last Year: Never true  Transportation Needs: No Transportation Needs (05/06/2023)   PRAPARE -  Administrator, Civil Service (Medical): No    Lack of Transportation (Non-Medical): No  Physical Activity: Inactive (05/06/2023)   Exercise Vital Sign    Days of Exercise per Week: 0 days    Minutes of Exercise per Session: 0 min  Stress: No Stress Concern Present (05/06/2023)   [employer] of Occupational Health - Occupational Stress Questionnaire    Feeling of Stress : Not at all  Social Connections: Moderately Integrated (05/06/2023)   Social Connection and Isolation Panel [NHANES]    Frequency of Communication with Friends and Family: Three times a week    Frequency of Social Gatherings with Friends and Family: Three times a  week    Attends Religious Services: Never    Active Member of Clubs or Organizations: Yes    Attends Banker Meetings: 1 to 4 times per year    Marital Status: Married  Catering manager Violence: Not At Risk (05/06/2023)   Humiliation, Afraid, Rape, and Kick questionnaire    Fear of Current or Ex-Partner: No    Emotionally Abused: No    Physically Abused: No    Sexually Abused: No   Family Status  Relation Name Status   Mother  Deceased   Father  Alive   Brother 1 Alive   Brother 2 Alive   Brother 3 Alive   MGM  (Not Specified)   MGF  (Not Specified)   PGF  Deceased   Neg Hx  (Not Specified)  No partnership data on file   Family History  Problem Relation Age of Onset   Heart disease Mother    Lymphoma Mother    Ovarian cancer Mother    Prostate cancer Father    Colon polyps Maternal Grandmother    Prostate cancer Maternal Grandfather    Heart attack Paternal Grandfather    Kidney disease Neg Hx    Allergies  Allergen Reactions   Azithromycin Other (See Comments)    Increased HR   Penicillins Nausea And Vomiting   Prednisone Other (See Comments)    Increased hr   Wellbutrin [Bupropion] Other (See Comments)    Jittery/shakes, dry mouth, felt bad    Patient Care Team: Malva Limes, MD as PCP - General (Family Medicine[facility], [facility] (Ophthalmology)   Medications: Outpatient Medications Prior to Visit  Medication Sig   omeprazole (PRILOSEC) 20 MG capsule Take 20 mg by mouth daily. prn   valACYclovir (VALTREX) 500 MG tablet Take 1 tablet (500 mg total) by mouth 2 (two) times daily.   sildenafil (REVATIO) 20 MG tablet TAKE 1-5 TABLETS BY MOUTH DAILY AS NEEDED   No facility-administered medications prior to visit.    Review of Systems  Constitutional:  Negative for chills, diaphoresis and fever.  HENT:  Negative for congestion, ear discharge, ear pain, hearing loss, nosebleeds, sore throat and tinnitus.   Eyes:  Negative for  photophobia, pain, discharge and redness.  Respiratory:  Negative for cough, shortness of breath, wheezing and stridor.   Cardiovascular:  Negative for chest pain, palpitations and leg swelling.  Gastrointestinal:  Negative for abdominal pain, blood in stool, constipation, diarrhea, nausea and vomiting.  Endocrine: Negative for polydipsia.  Genitourinary:  Positive for decreased urine volume, difficulty urinating, frequency and urgency. Negative for dysuria, flank pain and hematuria.  Musculoskeletal:  Negative for back pain, myalgias and neck pain.  Skin:  Negative for rash.  Allergic/Immunologic: Negative for environmental allergies.  Neurological:  Negative for dizziness, tremors, seizures,  weakness and headaches.  Hematological:  Does not bruise/bleed easily.  Psychiatric/Behavioral:  Negative for hallucinations and suicidal ideas. The patient is not nervous/anxious.       Objective    BP 102/69 (BP Location: Left Arm, Patient Position: Sitting, Cuff Size: Normal)   Pulse 60   Temp 97.6 F (36.4 C) (Oral)   Resp 16   Ht 5\' 10"  (1.778 m)   Wt 190 lb (86.2 kg)   BMI 27.26 kg/m    Physical Exam   General Appearance:    Well developed, well nourished male. Alert, cooperative, in no acute distress, appears stated age  Head:    Normocephalic, without obvious abnormality, atraumatic  Eyes:    PERRL, conjunctiva/corneas clear, EOM's intact, fundi    benign, both eyes       Ears:    Normal TM's and external ear canals, both ears  Nose:   Nares normal, septum midline, mucosa normal, no drainage   or sinus tenderness  Throat:   Lips, mucosa, and tongue normal; teeth and gums normal  Neck:   Supple, symmetrical, trachea midline, no adenopathy;       thyroid:  No enlargement/tenderness/nodules; no carotid   bruit or JVD  Back:     Symmetric, no curvature, ROM normal, no CVA tenderness  Lungs:     Clear to auscultation bilaterally, respirations unlabored  Chest wall:    No tenderness  or deformity  Heart:    Normal heart rate. Normal rhythm. No murmurs, rubs, or gallops.  S1 and S2 normal  Abdomen:     Soft, non-tender, bowel sounds active all four quadrants,    no masses, no organomegaly  Genitalia:    deferred  Rectal:    deferred  Extremities:   All extremities are intact. No cyanosis or edema  Pulses:   2+ and symmetric all extremities  Skin:   Skin color, texture, turgor normal, no rashes or lesions  Lymph nodes:   Cervical, supraclavicular, and axillary nodes normal  Neurologic:   CNII-XII intact. Normal strength, sensation and reflexes      throughout     Last depression screening scores    05/06/2023    8:30 AM 01/31/2023    3:47 PM 04/28/2022    9:24 AM  PHQ 2/9 Scores  PHQ - 2 Score 0 1 3  PHQ- 9 Score 0  3   Last fall risk screening    05/06/2023    8:30 AM  Fall Risk   Falls in the past year? 0  Number falls in past yr: 0  Injury with Fall? 0   Last Audit-C alcohol use screening    01/31/2023    3:46 PM  Alcohol Use Disorder Test (AUDIT)  1. How often do you have a drink containing alcohol? 0  2. How many drinks containing alcohol do you have on a typical day when you are drinking? 0  3. How often do you have six or more drinks on one occasion? 0  AUDIT-C Score 0   A score of 3 or more in women, and 4 or more in men indicates increased risk for alcohol abuse, EXCEPT if all of the points are from question 1   No results found for any visits on 05/06/23.  Assessment & Plan    Routine Health Maintenance and Physical Exam  Exercise Activities and Dietary recommendations  Goals   None     Immunization History  Administered Date(s) Administered   Influenza Whole 06/14/2017  PNEUMOCOCCAL CONJUGATE-20 02/16/2021   Td 09/11/2004   Tdap 10/10/2014, 06/14/2017   Zoster Recombinant(Shingrix) 06/14/2018    Health Maintenance  Topic Date Due   COVID-19 Vaccine (1) Never done   Zoster Vaccines- Shingrix (2 of 2) 08/09/2018   Colonoscopy   02/15/2022   Medicare Annual Wellness (AWV)  01/31/2024   DTaP/Tdap/Td (4 - Td or Tdap) 06/15/2027   Pneumonia Vaccine 35+ Years old  Completed   Hepatitis C Screening  Completed   HPV VACCINES  Aged Out   INFLUENZA VACCINE  Discontinued    Discussed health benefits of physical activity, and encouraged him to engage in regular exercise appropriate for his age and condition.  He declined recommended flu vaccine.      Annual complete physical exam - Administer influenza vaccine today.  -Scheduled colonoscopy consultation with Dr. Timothy Lasso at on October 8th.   Elevated Prolactin Levels -Last MRI and endocrinology follow up in 2022 showed no tumors or cancers.  -Order blood work to monitor prolactin levels. -Consider referral to endocrinologist, Dr. Gershon Crane, for further evaluation if levels remain high.  Sleep Apnea Reports snoring and poor sleep quality. Previous intolerance to CPAP machine. -Recommend patient follow up with ENT for potential sleep study referral. -Discuss potential benefits of modern CPAP machines.  Prostate Cancer Screening Family history of prostate cancer. -Order PSA test with routine blood work.  Hyperlipidemia Previous high cholesterol levels. -Order routine cholesterol test along with LPA and ApoB to better assess cardiovascular risk. -Discuss lifestyle modifications including diet and exercise.  Urinary Symptoms Reports slow urine stream and nocturia. -Consider trial of tamsulosin if PSA is normal.  ED -Refill Sildenafil prescription at [facility] pharmacy.     Vitamin D Deficiency - check vitamin D25-oh levels.        Mila Merry, MD  [facility] [phone] (phone) [phone] (fax)  [facility]

## 2023-05-08 ENCOUNTER — Other Ambulatory Visit: Payer: Self-pay | Admitting: Family Medicine

## 2023-05-08 DIAGNOSIS — E785 Hyperlipidemia, unspecified: Secondary | ICD-10-CM

## 2023-05-08 LAB — CBC WITH DIFFERENTIAL/PLATELET
Basophils Absolute: 0.1 10*3/uL (ref 0.0–0.2)
Basos: 1 %
EOS (ABSOLUTE): 0.2 10*3/uL (ref 0.0–0.4)
Eos: 2 %
Hematocrit: 46.3 % (ref 37.5–51.0)
Hemoglobin: 15.8 g/dL (ref 13.0–17.7)
Immature Grans (Abs): 0 10*3/uL (ref 0.0–0.1)
Immature Granulocytes: 0 %
Lymphocytes Absolute: 1.7 10*3/uL (ref 0.7–3.1)
Lymphs: 24 %
MCH: 30.8 pg (ref 26.6–33.0)
MCHC: 34.1 g/dL (ref 31.5–35.7)
MCV: 90 fL (ref 79–97)
Monocytes Absolute: 0.7 10*3/uL (ref 0.1–0.9)
Monocytes: 10 %
Neutrophils Absolute: 4.5 10*3/uL (ref 1.4–7.0)
Neutrophils: 63 %
Platelets: 227 10*3/uL (ref 150–450)
RBC: 5.13 x10E6/uL (ref 4.14–5.80)
RDW: 13 % (ref 11.6–15.4)
WBC: 7.2 10*3/uL (ref 3.4–10.8)

## 2023-05-08 LAB — COMPREHENSIVE METABOLIC PANEL
ALT: 12 IU/L (ref 0–44)
AST: 13 IU/L (ref 0–40)
Albumin: 4.5 g/dL (ref 3.9–4.9)
Alkaline Phosphatase: 72 IU/L (ref 44–121)
BUN/Creatinine Ratio: 15 (ref 10–24)
BUN: 17 mg/dL (ref 8–27)
Bilirubin Total: 0.8 mg/dL (ref 0.0–1.2)
CO2: 25 mmol/L (ref 20–29)
Calcium: 9.6 mg/dL (ref 8.6–10.2)
Chloride: 107 mmol/L — ABNORMAL HIGH (ref 96–106)
Creatinine, Ser: 1.12 mg/dL (ref 0.76–1.27)
Globulin, Total: 2 g/dL (ref 1.5–4.5)
Glucose: 91 mg/dL (ref 70–99)
Potassium: 4.9 mmol/L (ref 3.5–5.2)
Sodium: 143 mmol/L (ref 134–144)
Total Protein: 6.5 g/dL (ref 6.0–8.5)
eGFR: 71 mL/min/{1.73_m2} (ref >59)

## 2023-05-08 LAB — PSA: Prostate Specific Ag, Serum: 2 ng/mL (ref 0.0–4.0)

## 2023-05-08 LAB — APOLIPOPROTEIN B: Apolipoprotein B: 112 mg/dL — ABNORMAL HIGH (ref <90)

## 2023-05-08 LAB — LIPID PANEL WITH LDL/HDL RATIO
Cholesterol, Total: 199 mg/dL (ref 100–199)
HDL: 38 mg/dL — ABNORMAL LOW (ref >39)
LDL Chol Calc (NIH): 130 mg/dL — ABNORMAL HIGH (ref 0–99)
LDL/HDL Ratio: 3.4 ratio (ref 0.0–3.6)
Triglycerides: 170 mg/dL — ABNORMAL HIGH (ref 0–149)
VLDL Cholesterol Cal: 31 mg/dL (ref 5–40)

## 2023-05-08 LAB — LIPOPROTEIN A (LPA): Lipoprotein (a): <8.4 nmol/L (ref <75.0)

## 2023-05-08 LAB — TESTOSTERONE: Testosterone: 285 ng/dL (ref 264–916)

## 2023-05-08 LAB — PROLACTIN: Prolactin: 33.5 ng/mL — ABNORMAL HIGH (ref 3.6–25.2)

## 2023-05-08 LAB — VITAMIN D 25 HYDROXY (VIT D DEFICIENCY, FRACTURES): Vit D, 25-Hydroxy: 27.8 ng/mL — ABNORMAL LOW (ref 30.0–100.0)

## 2023-05-08 MED ORDER — ROSUVASTATIN CALCIUM 5 MG PO TABS: 5.0000 mg | ORAL_TABLET | Freq: Every day | ORAL | 3 refills | Status: DC

## 2023-05-12 DIAGNOSIS — M1990 Unspecified osteoarthritis, unspecified site: Secondary | ICD-10-CM | POA: Diagnosis not present

## 2023-05-12 DIAGNOSIS — E221 Hyperprolactinemia: Secondary | ICD-10-CM | POA: Diagnosis not present

## 2023-05-12 DIAGNOSIS — D352 Benign neoplasm of pituitary gland: Secondary | ICD-10-CM | POA: Diagnosis not present

## 2023-05-12 DIAGNOSIS — G4733 Obstructive sleep apnea (adult) (pediatric): Secondary | ICD-10-CM | POA: Diagnosis not present

## 2023-05-12 DIAGNOSIS — Z809 Family history of malignant neoplasm, unspecified: Secondary | ICD-10-CM | POA: Diagnosis not present

## 2023-05-12 DIAGNOSIS — J45909 Unspecified asthma, uncomplicated: Secondary | ICD-10-CM | POA: Diagnosis not present

## 2023-05-12 DIAGNOSIS — N529 Male erectile dysfunction, unspecified: Secondary | ICD-10-CM | POA: Diagnosis not present

## 2023-05-12 DIAGNOSIS — E785 Hyperlipidemia, unspecified: Secondary | ICD-10-CM | POA: Diagnosis not present

## 2023-05-12 DIAGNOSIS — K219 Gastro-esophageal reflux disease without esophagitis: Secondary | ICD-10-CM | POA: Diagnosis not present

## 2023-05-25 DIAGNOSIS — G4733 Obstructive sleep apnea (adult) (pediatric): Secondary | ICD-10-CM | POA: Diagnosis not present

## 2023-05-26 DIAGNOSIS — G4733 Obstructive sleep apnea (adult) (pediatric): Secondary | ICD-10-CM | POA: Diagnosis not present

## 2023-05-31 DIAGNOSIS — G4733 Obstructive sleep apnea (adult) (pediatric): Secondary | ICD-10-CM | POA: Diagnosis not present

## 2023-06-15 DIAGNOSIS — G4733 Obstructive sleep apnea (adult) (pediatric): Secondary | ICD-10-CM | POA: Diagnosis not present

## 2023-07-19 DIAGNOSIS — G4733 Obstructive sleep apnea (adult) (pediatric): Secondary | ICD-10-CM | POA: Diagnosis not present

## 2023-08-02 ENCOUNTER — Other Ambulatory Visit: Payer: Self-pay | Admitting: Family Medicine

## 2023-08-02 ENCOUNTER — Encounter: Payer: Self-pay | Admitting: Family Medicine

## 2023-08-02 DIAGNOSIS — E559 Vitamin D deficiency, unspecified: Secondary | ICD-10-CM

## 2023-08-02 DIAGNOSIS — E785 Hyperlipidemia, unspecified: Secondary | ICD-10-CM

## 2023-08-05 ENCOUNTER — Other Ambulatory Visit: Payer: Self-pay | Admitting: Family Medicine

## 2023-08-05 DIAGNOSIS — E785 Hyperlipidemia, unspecified: Secondary | ICD-10-CM

## 2023-08-05 NOTE — Telephone Encounter (Signed)
Patient called and left detailed message per DPR to come in to have lab work done.

## 2023-08-05 NOTE — Telephone Encounter (Signed)
.  RXP

## 2023-08-17 IMAGING — MR MR HEAD WO/W CM
12 of 19 series · 24 of 48 positions shown · IV contrast (8ml Gadavist)
Comparison: MRI of the brain February 01, 2012.

CLINICAL DATA: Elevated prolactin.  Androgen deficiency.

EXAM:
MRI HEAD WITHOUT AND WITH CONTRAST
TECHNIQUE: Multiplanar, multiecho pulse sequences of the brain and surrounding
structures were obtained without and with intravenous contrast.
CONTRAST:  8mL GADAVIST GADOBUTROL 1 MMOL/ML IV SOLN

[Series 8: T2 · axial · 5.0mm · 0.53mm/px · z∈[-61,+90]mm · 2 of 27 slices shown (1 of 2)]
[im 1/27]
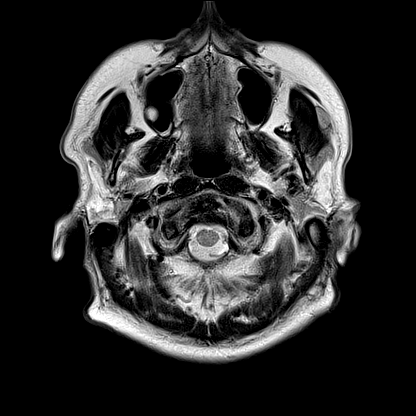
[im 27/27]
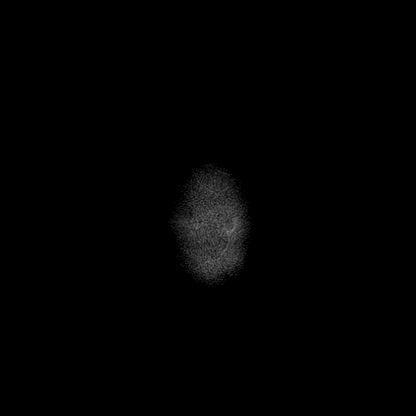

[Series 10: FLAIR · axial · 3.0mm · 0.53mm/px · z∈[-64,+93]mm · 5 of 55 slices shown]
[im 1/55]
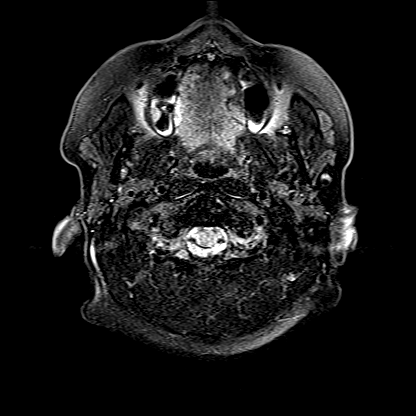
[im 14/55]
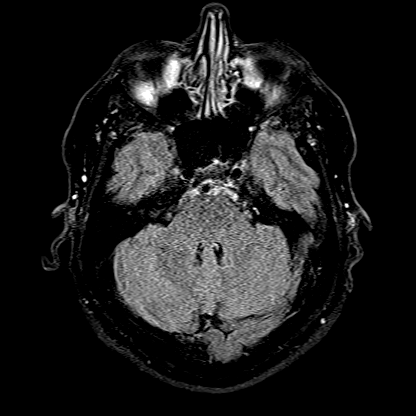
[im 28/55]
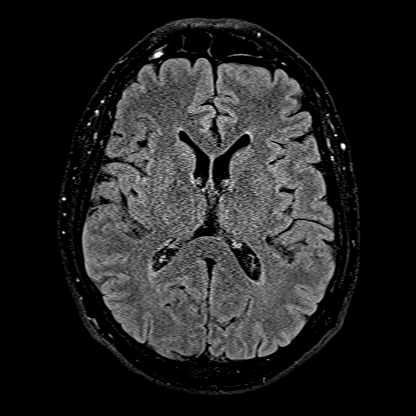
[im 41/55]
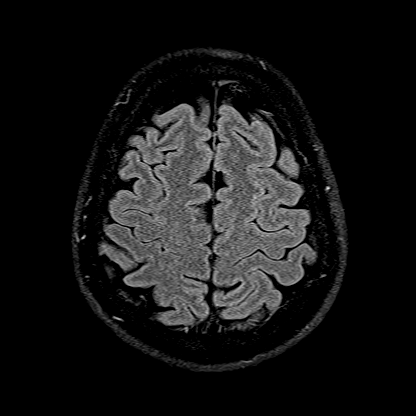
[im 55/55]
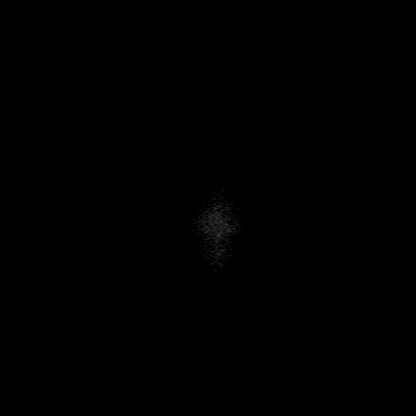

[Series 13: T2 · coronal · 3.0mm · 0.21mm/px · 1 of 15 slices shown (2 of 2)]
[im 1/15]
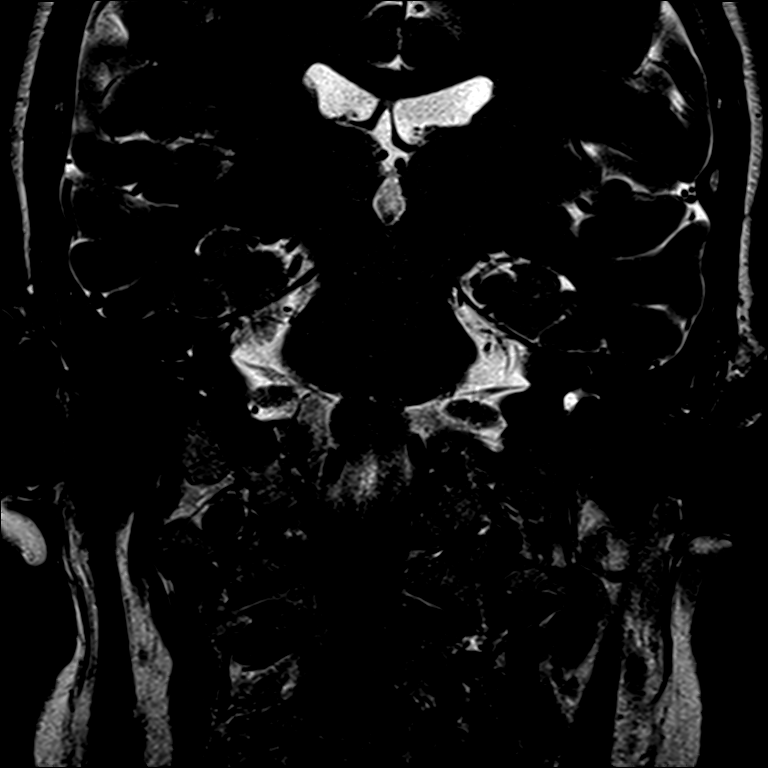

[Series 15: T1 post-contrast · coronal · 3.0mm · 0.28mm/px · 1 of 11 slices shown (1 of 9)]
[im 1/11]
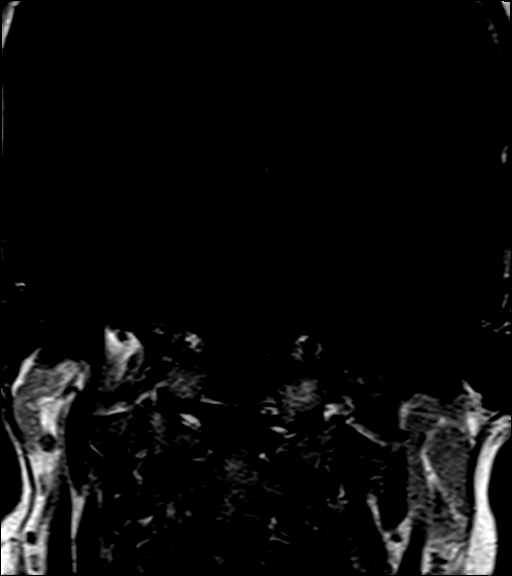

[Series 16: T1 post-contrast · coronal · 3.0mm · 0.28mm/px · 1 of 11 slices shown (2 of 9)]
[im 1/11]
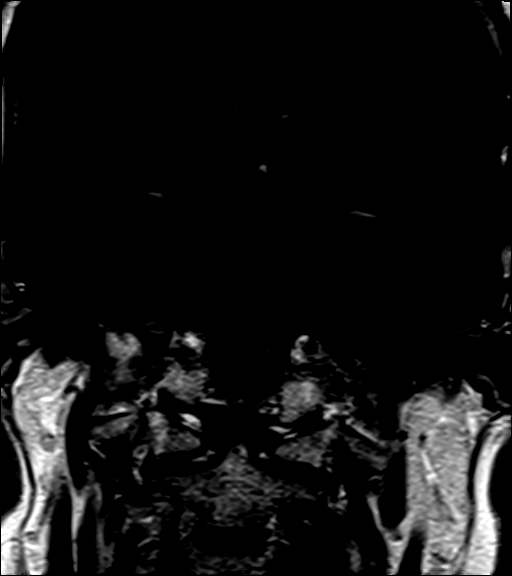

[Series 17: T1 post-contrast · coronal · 3.0mm · 0.28mm/px · 1 of 11 slices shown (3 of 9)]
[im 1/11]
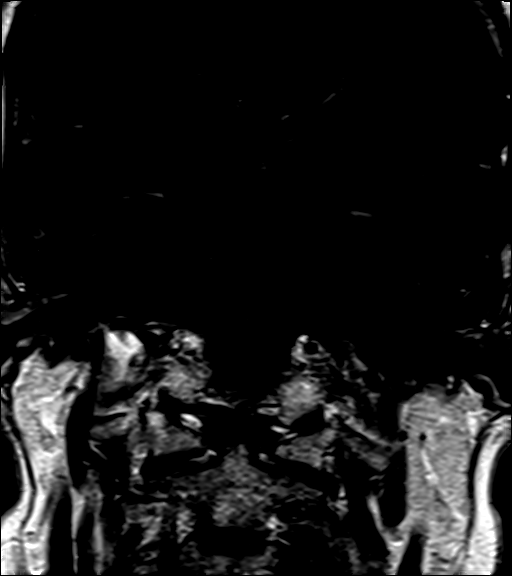

[Series 18: T1 post-contrast · coronal · 3.0mm · 0.28mm/px · 1 of 11 slices shown (4 of 9)]
[im 1/11]
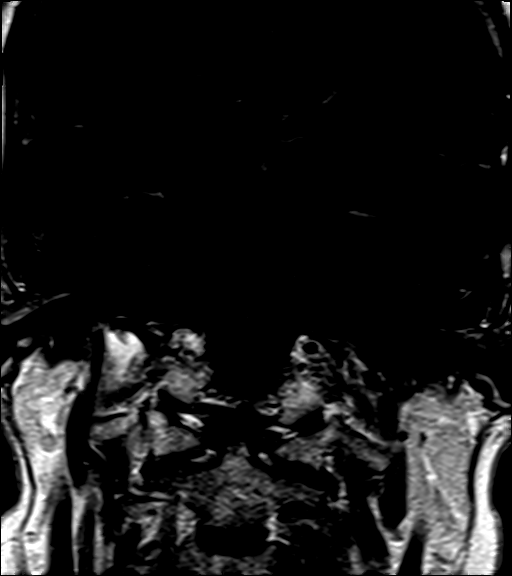

[Series 19: T1 post-contrast · coronal · 3.0mm · 0.28mm/px · 1 of 11 slices shown (5 of 9)]
[im 1/11]
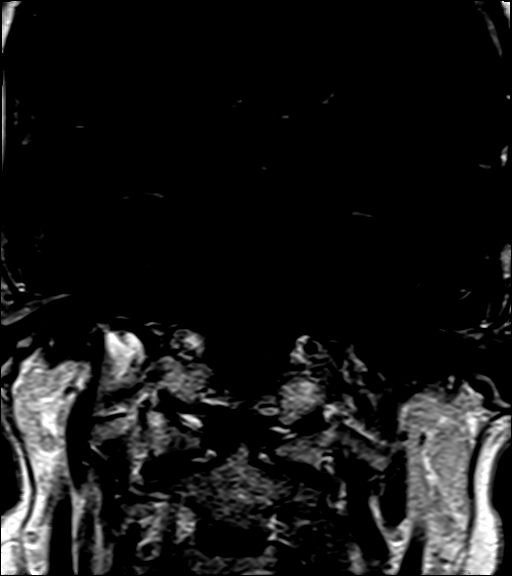

[Series 20: T1 post-contrast · coronal · 3.0mm · 0.28mm/px · 1 of 11 slices shown (6 of 9)]
[im 1/11]
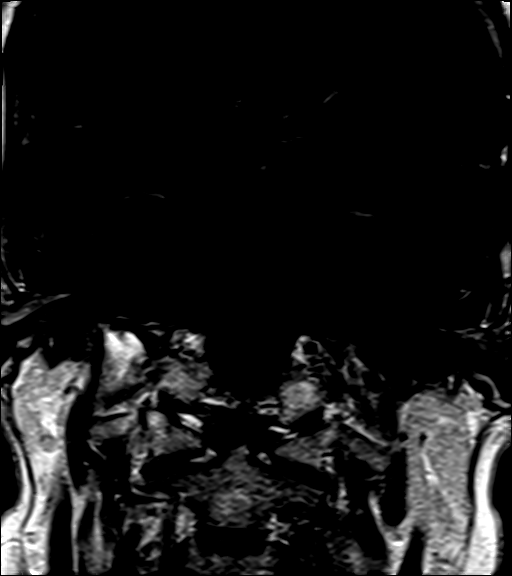

[Series 21: T1 post-contrast · coronal · 3.0mm · 0.21mm/px · 1 of 15 slices shown (7 of 9)]
[im 1/15]
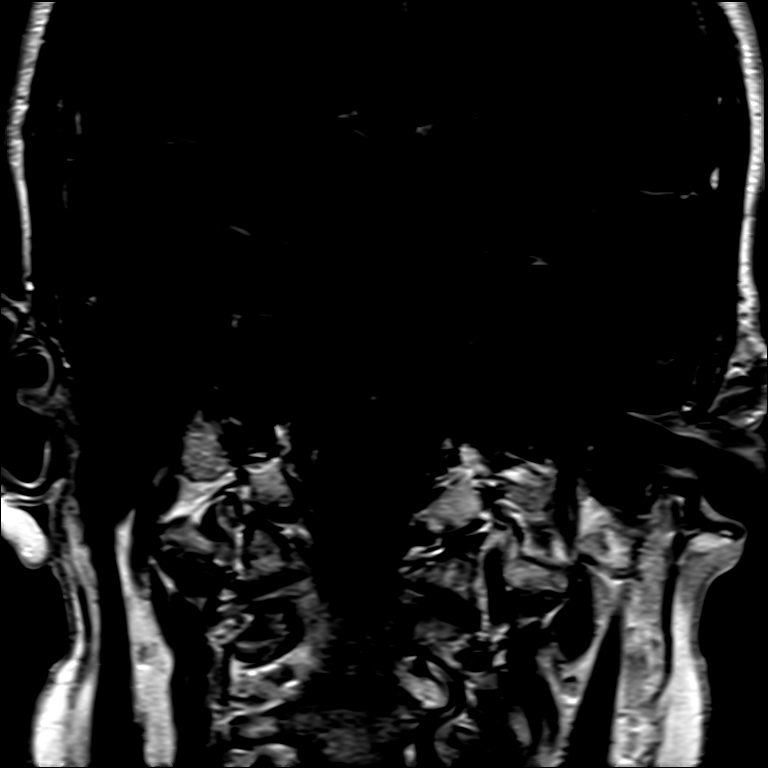

[Series 22: T1 post-contrast · sagittal · 3.0mm · 0.21mm/px · 1 of 15 slices shown (8 of 9)]
[im 1/15]
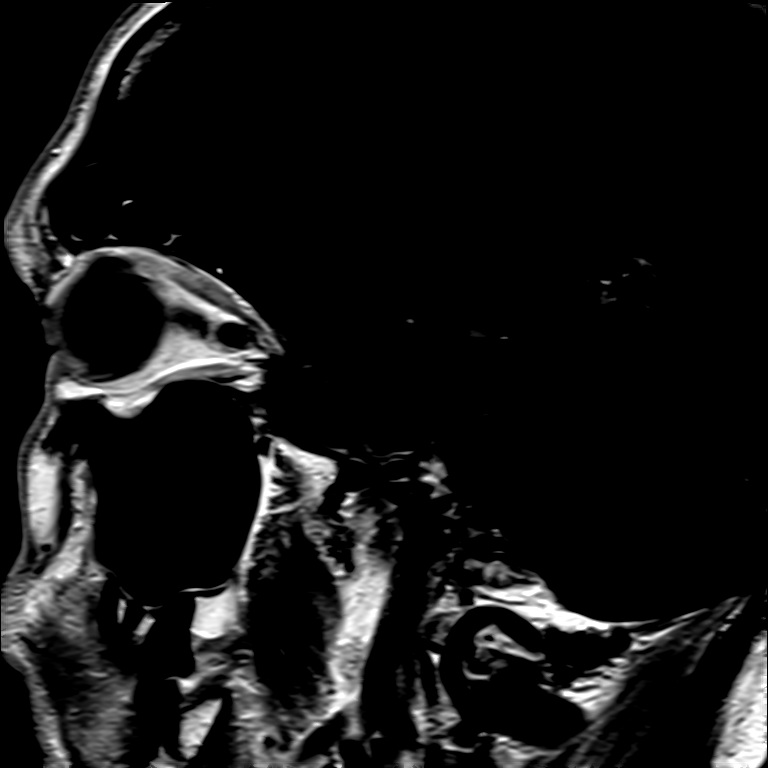

[Series 23: T1 post-contrast · axial · 1.0mm · 0.98mm/px · z∈[-65,+104]mm · 8 of 176 slices shown (9 of 9)]
[im 1/176]
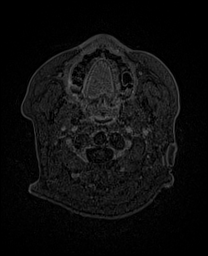
[im 26/176]
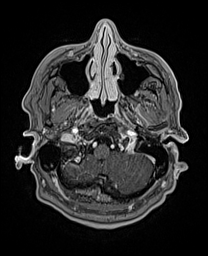
[im 51/176]
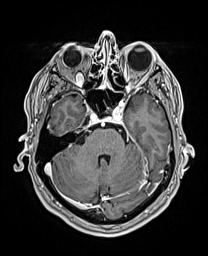
[im 76/176]
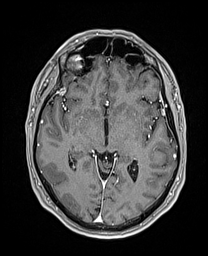
[im 101/176]
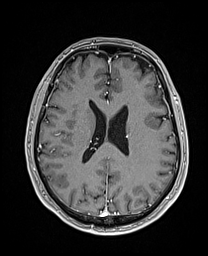
[im 126/176]
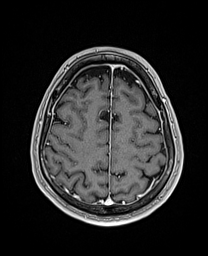
[im 151/176]
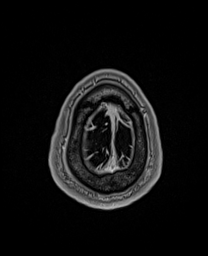
[im 176/176]
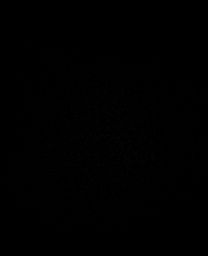

[24 of 48 positions shown; findings below may reference images not displayed]

FINDINGS: Brain: No acute infarction, hemorrhage, hydrocephalus, extra-axial
collection or mass lesion. The brain parenchyma has normal
morphology and signal characteristics. No focus of abnormal contrast
enhancement.

Pituitary/Sella: The pituitary gland is normal in appearance without
mass lesion. The infundibulum is midline. The hypothalamus and
mamillary bodies are normal. There is no mass effect on the optic
chiasm or optic nerves. The infundibular and chiasmatic recesses are
clear. Normal cavernous sinus and cavernous internal carotid artery
flow voids.

Vascular: Normal flow voids.

Skull and upper cervical spine: Normal marrow signal.

Sinuses/Orbits: Negative.

Other: None.
IMPRESSION: Unremarkable MRI of the brain.  No evidence of pituitary lesion.

## 2023-08-19 ENCOUNTER — Other Ambulatory Visit: Payer: Self-pay | Admitting: Family Medicine

## 2023-08-19 DIAGNOSIS — E785 Hyperlipidemia, unspecified: Secondary | ICD-10-CM

## 2023-08-22 NOTE — Telephone Encounter (Signed)
Requested medication (s) are due for refill today: yes  Requested medication (s) are on the active medication list: yes  Last refill:  08/05/23 #30  Future visit scheduled: no   Notes to clinic:  called pt and LM on VM that he has labwork that needs to be done. Hours of lab given.    Requested Prescriptions  Pending Prescriptions Disp Refills   rosuvastatin (CRESTOR) 5 MG tablet [Pharmacy Med Name: ROSUVASTATIN CALCIUM 5 MG TAB] 90 tablet 1    Sig: Take 1 tablet (5 mg total) by mouth daily. COME TO OFFICE FOR LAB WORK BEFORE ADDITIONAL REFILLS     Cardiovascular:  Antilipid - Statins 2 Failed - 08/22/2023 12:26 PM      Failed - Lipid Panel in normal range within the last 12 months    Cholesterol, Total  Date Value Ref Range Status  05/06/2023 199 100 - 199 mg/dL Final   LDL Chol Calc (NIH)  Date Value Ref Range Status  05/06/2023 130 (H) 0 - 99 mg/dL Final   HDL  Date Value Ref Range Status  05/06/2023 38 (L) >39 mg/dL Final   Triglycerides  Date Value Ref Range Status  05/06/2023 170 (H) 0 - 149 mg/dL Final         Passed - Cr in normal range and within 360 days    Creatinine, Ser  Date Value Ref Range Status  05/06/2023 1.12 0.76 - 1.27 mg/dL Final         Passed - Patient is not pregnant      Passed - Valid encounter within last 12 months    Recent Outpatient Visits           3 months ago Annual physical exam   [facility] Malva Limes, MD   1 year ago Encounter for Medicare annual wellness exam   [facility] Maple Hudson., MD   1 year ago Other hemorrhoids   [facility] Maple Hudson., MD   1 year ago Colon cancer screening   [facility] [place], [place], PA-C   1 year ago Leg cramps   [facility] Jacky Kindle, [place]       Future Appointments             In 2 months Deirdre Evener, MD [facility]  [facility]

## 2023-09-06 DIAGNOSIS — R29898 Other symptoms and signs involving the musculoskeletal system: Secondary | ICD-10-CM | POA: Diagnosis not present

## 2023-09-06 DIAGNOSIS — G4733 Obstructive sleep apnea (adult) (pediatric): Secondary | ICD-10-CM | POA: Diagnosis not present

## 2023-09-06 DIAGNOSIS — R351 Nocturia: Secondary | ICD-10-CM | POA: Diagnosis not present

## 2023-09-06 DIAGNOSIS — D126 Benign neoplasm of colon, unspecified: Secondary | ICD-10-CM | POA: Diagnosis not present

## 2023-09-06 DIAGNOSIS — E782 Mixed hyperlipidemia: Secondary | ICD-10-CM | POA: Diagnosis not present

## 2023-09-06 DIAGNOSIS — D352 Benign neoplasm of pituitary gland: Secondary | ICD-10-CM | POA: Diagnosis not present

## 2023-09-14 ENCOUNTER — Other Ambulatory Visit: Payer: Self-pay | Admitting: Family Medicine

## 2023-09-14 DIAGNOSIS — E782 Mixed hyperlipidemia: Secondary | ICD-10-CM

## 2023-09-14 DIAGNOSIS — D126 Benign neoplasm of colon, unspecified: Secondary | ICD-10-CM

## 2023-09-20 ENCOUNTER — Ambulatory Visit
Admission: RE | Admit: 2023-09-20 | Discharge: 2023-09-20 | Disposition: A | Discharge status: Home / Self Care | Payer: Self-pay | Source: Ambulatory Visit | Attending: Family Medicine | Admitting: Family Medicine

## 2023-09-20 DIAGNOSIS — D126 Benign neoplasm of colon, unspecified: Secondary | ICD-10-CM | POA: Insufficient documentation

## 2023-09-20 DIAGNOSIS — E782 Mixed hyperlipidemia: Secondary | ICD-10-CM | POA: Insufficient documentation

## 2023-10-27 ENCOUNTER — Ambulatory Visit: Payer: Medicare HMO | Admitting: Dermatology

## 2023-11-16 DIAGNOSIS — D352 Benign neoplasm of pituitary gland: Secondary | ICD-10-CM | POA: Diagnosis not present

## 2023-11-16 DIAGNOSIS — G4733 Obstructive sleep apnea (adult) (pediatric): Secondary | ICD-10-CM | POA: Diagnosis not present

## 2023-11-16 DIAGNOSIS — D126 Benign neoplasm of colon, unspecified: Secondary | ICD-10-CM | POA: Diagnosis not present

## 2023-11-16 DIAGNOSIS — E782 Mixed hyperlipidemia: Secondary | ICD-10-CM | POA: Diagnosis not present

## 2023-11-22 ENCOUNTER — Ambulatory Visit (INDEPENDENT_AMBULATORY_CARE_PROVIDER_SITE_OTHER): Payer: No Typology Code available for payment source | Admitting: Dermatology

## 2023-11-22 ENCOUNTER — Encounter: Payer: Self-pay | Admitting: Dermatology

## 2023-11-22 DIAGNOSIS — Z1283 Encounter for screening for malignant neoplasm of skin: Secondary | ICD-10-CM | POA: Diagnosis not present

## 2023-11-22 DIAGNOSIS — I781 Nevus, non-neoplastic: Secondary | ICD-10-CM | POA: Diagnosis not present

## 2023-11-22 DIAGNOSIS — I8393 Asymptomatic varicose veins of bilateral lower extremities: Secondary | ICD-10-CM

## 2023-11-22 DIAGNOSIS — L603 Nail dystrophy: Secondary | ICD-10-CM

## 2023-11-22 DIAGNOSIS — W908XXA Exposure to other nonionizing radiation, initial encounter: Secondary | ICD-10-CM

## 2023-11-22 DIAGNOSIS — D229 Melanocytic nevi, unspecified: Secondary | ICD-10-CM

## 2023-11-22 DIAGNOSIS — D1801 Hemangioma of skin and subcutaneous tissue: Secondary | ICD-10-CM

## 2023-11-22 DIAGNOSIS — Z7189 Other specified counseling: Secondary | ICD-10-CM

## 2023-11-22 DIAGNOSIS — L853 Xerosis cutis: Secondary | ICD-10-CM | POA: Diagnosis not present

## 2023-11-22 DIAGNOSIS — L578 Other skin changes due to chronic exposure to nonionizing radiation: Secondary | ICD-10-CM | POA: Diagnosis not present

## 2023-11-22 DIAGNOSIS — L821 Other seborrheic keratosis: Secondary | ICD-10-CM | POA: Diagnosis not present

## 2023-11-22 DIAGNOSIS — L719 Rosacea, unspecified: Secondary | ICD-10-CM

## 2023-11-22 DIAGNOSIS — L814 Other melanin hyperpigmentation: Secondary | ICD-10-CM

## 2023-11-22 DIAGNOSIS — L718 Other rosacea: Secondary | ICD-10-CM

## 2023-11-22 DIAGNOSIS — Z79899 Other long term (current) drug therapy: Secondary | ICD-10-CM

## 2023-11-22 NOTE — Patient Instructions (Addendum)
 Recommend Biotin 2.5 mg take by mouth daily or Derma Nail   Recommend applying OTC DermaNail conditioner to cuticle area of fingernails twice daily to help with chipping/breaking.  May take 3-6 months of regular use to get best result.  Could also add OTC Biotin supplement 2.5 mg daily to help strengthen nails.  Recommend mild soap with handwashing followed by a thick moisturizing cream to fingernails.  May use Cutemol cream,which comes with DermaNail, Neutrogena [place] hand cream, or Eucerin cream original.    Recommend Cerave moisturizer daily   Gentle Skin Care Guide  1. Bathe no more than once a day.  2. Avoid bathing in hot water  3. Use a mild soap like Dove, Vanicream, Cetaphil, CeraVe. Can use Lever 2000 or Cetaphil antibacterial soap  4. Use soap only where you need it. On most days, use it under your arms, between your legs, and on your feet. Let the water rinse other areas unless visibly dirty.  5. When you get out of the bath/shower, use a towel to gently blot your skin dry, don't rub it.  6. While your skin is still a little damp, apply a moisturizing cream such as Vanicream, CeraVe, Cetaphil, Eucerin, Sarna lotion or plain Vaseline Jelly. For hands apply Neutrogena [place] Hand Cream or Excipial Hand Cream.  7. Reapply moisturizer any time you start to itch or feel dry.  8. Sometimes using free and clear laundry detergents can be helpful. Fabric softener sheets should be avoided. Downy Free & Gentle liquid, or any liquid fabric softener that is free of dyes and perfumes, it acceptable to use  9. If your doctor has given you prescription creams you may apply moisturizers over them       Melanoma ABCDEs  Melanoma is the most dangerous type of skin cancer, and is the leading cause of death from skin disease.  You are more likely to develop melanoma if you: Have light-colored skin, light-colored eyes, or red or blond hair Spend a lot of time in the sun Tan  regularly, either outdoors or in a tanning bed Have had blistering sunburns, especially during childhood Have a close family member who has had a melanoma Have atypical moles or large birthmarks  Early detection of melanoma is key since treatment is typically straightforward and cure rates are extremely high if we catch it early.   The first sign of melanoma is often a change in a mole or a new dark spot.  The ABCDE system is a way of remembering the signs of melanoma.  A for asymmetry:  The two halves do not match. B for border:  The edges of the growth are irregular. C for color:  A mixture of colors are present instead of an even brown color. D for diameter:  Melanomas are usually (but not always) greater than 6mm - the size of a pencil eraser. E for evolution:  The spot keeps changing in size, shape, and color.  Please check your skin once per month between visits. You can use a small mirror in front and a large mirror behind you to keep an eye on the back side or your body.   If you see any new or changing lesions before your next follow-up, please call to schedule a visit.  Please continue daily skin protection including broad spectrum sunscreen SPF 30+ to sun-exposed areas, reapplying every 2 hours as needed when you're outdoors.   Staying in the shade or wearing long sleeves, sun glasses (UVA+UVB protection)  and wide brim hats (4-inch brim around the entire circumference of the hat) are also recommended for sun protection.    Due to recent changes in healthcare laws, you may see results of your pathology and/or laboratory studies on MyChart before the doctors have had a chance to review them. We understand that in some cases there may be results that are confusing or concerning to you. Please understand that not all results are received at the same time and often the doctors may need to interpret multiple results in order to provide you with the best plan of care or course of  treatment. Therefore, we ask that you please give [place] 2 business days to thoroughly review all your results before contacting the office for clarification. Should we see a critical lab result, you will be contacted sooner.   If You Need Anything After Your Visit  If you have any questions or concerns for your doctor, please call our main line at [phone] and press option 4 to reach your doctor's medical assistant. If no one answers, please leave a voicemail as directed and we will return your call as soon as possible. Messages left after 4 pm will be answered the following business day.   You may also send [place] a message via MyChart. We typically respond to MyChart messages within 1-2 business days.  For prescription refills, please ask your pharmacy to contact our office. Our fax number is [phone].  If you have an urgent issue when the clinic is closed that cannot wait until the next business day, you can page your doctor at the number below.    Please note that while we do our best to be available for urgent issues outside of office hours, we are not available 24/7.   If you have an urgent issue and are unable to reach [place], you may choose to seek medical care at your doctor's office, retail clinic, urgent care center, or emergency room.  If you have a medical emergency, please immediately call 911 or go to the emergency department.  Pager Numbers  - Dr. Gwen Pounds: [phone]  - Dr. Roseanne Reno: [phone]  - Dr. Katrinka Blazing: [phone]   In the event of inclement weather, please call our main line at [phone] for an update on the status of any delays or closures.  Dermatology Medication Tips: Please keep the boxes that topical medications come in in order to help keep track of the instructions about where and how to use these. Pharmacies typically print the medication instructions only on the boxes and not directly on the medication tubes.   If your medication is too expensive,  please contact our office at [phone] option 4 or send [place] a message through MyChart.   We are unable to tell what your co-pay for medications will be in advance as this is different depending on your insurance coverage. However, we may be able to find a substitute medication at lower cost or fill out paperwork to get insurance to cover a needed medication.   If a prior authorization is required to get your medication covered by your insurance company, please allow [place] 1-2 business days to complete this process.  Drug prices often vary depending on where the prescription is filled and some pharmacies may offer cheaper prices.  The website www.goodrx.com contains coupons for medications through different pharmacies. The prices here do not account for what the cost may be with help from insurance (it may be cheaper with your insurance), but the website  can give you the price if you did not use any insurance.  - You can print the associated coupon and take it with your prescription to the pharmacy.  - You may also stop by our office during regular business hours and pick up a GoodRx coupon card.  - If you need your prescription sent electronically to a different pharmacy, notify our office through [facility] or by phone at [phone] option 4.     Si Usted Necesita Algo Despus de Su Visita  Tambin puede enviarnos un mensaje a travs de Clinical cytogeneticist. Por lo general respondemos a los mensajes de MyChart en el transcurso de 1 a 2 das hbiles.  Para renovar recetas, por favor pida a su farmacia que se ponga en contacto con nuestra oficina. Annie Sable de fax es [place] [phone].  Si tiene un asunto urgente cuando la clnica est cerrada y que no puede esperar hasta el siguiente da hbil, puede llamar/localizar a su doctor(a) al nmero que aparece a continuacin.   Por favor, tenga en cuenta que aunque hacemos todo lo posible para estar disponibles para asuntos urgentes fuera del  horario de [place], no estamos disponibles las 24 horas del da, los 7 [address]  [address] de la [place].   Si tiene un problema urgente y no puede comunicarse con nosotros, puede optar por buscar atencin mdica  en el consultorio de su doctor(a), en una clnica privada, en un centro de atencin urgente o en una sala de emergencias.  Si tiene Engineer, drilling, por favor llame inmediatamente al 911 o vaya a la sala de emergencias.  Nmeros de bper  - Dr. Gwen Pounds: [phone]  - Dra. Roseanne Reno: [phone]  - Dr. Katrinka Blazing: [phone]   En caso de inclemencias del tiempo, por favor llame a Lacy Duverney principal al [phone] para una actualizacin sobre el [place] de cualquier retraso o cierre.  Consejos para la medicacin en dermatologa: Por favor, guarde las cajas en las que vienen los medicamentos de uso tpico para ayudarle a seguir las instrucciones sobre dnde y cmo usarlos. Las farmacias generalmente imprimen las instrucciones del medicamento slo en las cajas y no directamente en los tubos del [place].   Si su medicamento es muy caro, por favor, pngase en contacto con Rolm Gala llamando al [phone] y presione la opcin 4 o envenos un mensaje a travs de Clinical cytogeneticist.   No podemos decirle cul ser su copago por los medicamentos por adelantado ya que esto es diferente dependiendo de la cobertura de su seguro. Sin embargo, es posible que podamos encontrar un medicamento sustituto a Audiological scientist un formulario para que el seguro cubra el medicamento que se considera necesario.   Si se requiere una autorizacin previa para que su compaa de seguros [place] su medicamento, por favor permtanos de 1 a 2 das hbiles para completar [address]  Los precios de los medicamentos varan con frecuencia dependiendo del Environmental consultant de dnde se surte la receta y alguna farmacias pueden ofrecer precios ms baratos.  El sitio web www.goodrx.com tiene cupones para medicamentos de Engineer, civil (consulting). Los precios aqu no tienen en cuenta lo que podra costar con la ayuda del seguro (puede ser ms barato con su seguro), pero el sitio web puede darle el precio si no utiliz Tourist information centre manager.  - Puede imprimir el cupn correspondiente y llevarlo con su receta a la farmacia.  - Tambin puede pasar por nuestra oficina durante el horario de atencin regular y Education officer, museum una tarjeta de cupones de GoodRx.  -  Si necesita que su receta se enve electrnicamente a Psychiatrist, informe a nuestra oficina a travs de MyChart de [place] o por telfono llamando al [phone] y presione la opcin 4.

## 2023-11-22 NOTE — Progress Notes (Signed)
 Follow-Up Visit   Subjective  Danny Ortiz is a 68 y.o. male who presents for the following: Skin Cancer Screening and Full Body Skin Exam Hx of rosacea with ocular Bx proven neurofibroma base involved at right mid back paraspinal   The patient presents for Total-Body Skin Exam (TBSE) for skin cancer screening and mole check. The patient has spots, moles and lesions to be evaluated, some may be new or changing and the patient may have concern these could be cancer.  The following portions of the chart were reviewed this encounter and updated as appropriate: medications, allergies, medical history  Review of Systems:  No other skin or systemic complaints except as noted in HPI or Assessment and Plan.  Objective  Well appearing patient in no apparent distress; mood and affect are within normal limits.  A full examination was performed including scalp, head, eyes, ears, nose, lips, neck, chest, axillae, abdomen, back, buttocks, bilateral upper extremities, bilateral lower extremities, hands, feet, fingers, toes, fingernails, and toenails. All findings within normal limits unless otherwise noted below.   Relevant physical exam findings are noted in the Assessment and Plan.   Assessment & Plan   SKIN CANCER SCREENING PERFORMED TODAY.  ACTINIC DAMAGE - Chronic condition, secondary to cumulative UV/sun exposure - diffuse scaly erythematous macules with underlying dyspigmentation - Recommend daily broad spectrum sunscreen SPF 30+ to sun-exposed areas, reapply every 2 hours as needed.  - Staying in the shade or wearing long sleeves, sun glasses (UVA+UVB protection) and wide brim hats (4-inch brim around the entire circumference of the hat) are also recommended for sun protection.  - Call for new or changing lesions.  LENTIGINES, SEBORRHEIC KERATOSES, HEMANGIOMAS - Benign normal skin lesions - Benign-appearing - Call for any changes  MELANOCYTIC NEVI - Tan-brown and/or  pink-flesh-colored symmetric macules and papules - Benign appearing on exam today - Observation - Call clinic for new or changing moles - Recommend daily use of broad spectrum spf 30+ sunscreen to sun-exposed areas.    Rosacea With Ocular Rosacea Exam : redness on nose and mid face with dilated vessels , clear at eyes today  Chronic and persistent condition with duration or expected duration over one year. Condition is symptomatic / bothersome to patient. Not to goal. Rosacea is a chronic progressive skin condition usually affecting the face of adults, causing redness and/or acne bumps. It is treatable but not curable. It sometimes affects the eyes (ocular rosacea) as well. It may respond to topical and/or systemic medication and can flare with stress, sun exposure, alcohol, exercise, topical steroids (including hydrocortisone/cortisone 10) and some foods.  Daily application of broad spectrum spf 30+ sunscreen to face is recommended to reduce flares. Patient denies grittiness of eyes  Plan:  Discussed could restart Doxycycline 40 mg  1 po qd with food and plenty of fluid Patient is not bothered by redness and deferred starting any treatments   Xerosis - diffuse xerotic patches - recommend gentle, hydrating skin care - gentle skin care handout given Recommend cerave moisturizer cream daily   Varicose Veins/Spider Veins - Dilated blue, purple or red veins at the lower extremities - Reassured - Smaller vessels can be treated by sclerotherapy (a procedure to inject a medicine into the veins to make them disappear) if desired, but the treatment is not covered by insurance. Larger vessels may be covered if symptomatic and we would refer to vascular surgeon if treatment desired.   NAIL PROBLEM at b/l toenails Exam: dystrophic nail with minimal  scaly at feet  Treatment Plan: Discussed could be caused psoriasis, trauma or fungual  Could be dryness  Molecular study discussed. Sent nail samples  today. Nail id to determine a treatment. A sample of left great toenail taken today and sent to Nail ID for further testing.  Fingernail Ridging - benign - normal aging change Recommend applying OTC DermaNail conditioner to cuticle area of fingernails twice daily to help with chipping/breaking.  May take 3-6 months of regular use to get best result.  Could also add OTC Biotin supplement 2.5 mg daily to help strengthen nails.  Recommend mild soap with handwashing followed by a thick moisturizing cream to fingernails.  May use Cutemol cream,which comes with DermaNail, Neutrogena [place] hand cream, or Eucerin cream original.   SKIN CANCER SCREENING   ACTINIC SKIN DAMAGE   ROSACEA   LENTIGO   MELANOCYTIC NEVUS, UNSPECIFIED LOCATION   NAIL DYSTROPHY   Return in about 1 year (around 11/21/2024) for TBSE.  IAsher Muir, CMA, am acting as scribe for Armida Sans, MD.   Documentation: I have reviewed the above documentation for accuracy and completeness, and I agree with the above.  Armida Sans, MD

## 2023-11-24 ENCOUNTER — Telehealth: Payer: Self-pay

## 2023-11-24 NOTE — Telephone Encounter (Addendum)
 [employer] Nail ID results in media. aw

## 2023-11-29 ENCOUNTER — Telehealth: Payer: Self-pay

## 2023-11-29 NOTE — Telephone Encounter (Signed)
 Patient returned called to [facility] about recent nail study results.   Patient given Dr. Gwen Pounds 's results and recommendations. Please advise pt that the molecular study 11/22/2023 of toenail showed NO ABNORMAL infection.  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  The conclusion is that the nail changes are likely from trauma (Psoriasis can also cause nail changes, but there is no history of psoriasis in this pt).  No treatment recommended.    He verbalized understanding and denied further questions

## 2023-11-29 NOTE — Telephone Encounter (Signed)
 Left message for patient to return call to the office. aw

## 2023-12-01 DIAGNOSIS — H43813 Vitreous degeneration, bilateral: Secondary | ICD-10-CM | POA: Diagnosis not present

## 2023-12-01 DIAGNOSIS — H2513 Age-related nuclear cataract, bilateral: Secondary | ICD-10-CM | POA: Diagnosis not present

## 2024-02-01 ENCOUNTER — Ambulatory Visit: Payer: Self-pay

## 2024-02-01 DIAGNOSIS — Z0000 Encounter for general adult medical examination without abnormal findings: Secondary | ICD-10-CM

## 2024-02-01 DIAGNOSIS — Z1211 Encounter for screening for malignant neoplasm of colon: Secondary | ICD-10-CM

## 2024-02-01 NOTE — Progress Notes (Signed)
 Subjective:   Danny Ortiz is a 68 y.o. who presents for a Medicare Wellness preventive visit.  As a reminder, Annual Wellness Visits don't include a physical exam, and some assessments may be limited, especially if this visit is performed virtually. We may recommend an in-person follow-up visit with your provider if needed.  Visit Complete: Virtual I connected with  Danny Ortiz on 02/01/24 by a audio enabled telemedicine application and verified that I am speaking with the correct person using two identifiers.  Patient Location: Home  Provider Location: Home Office  I discussed the limitations of evaluation and management by telemedicine. The patient expressed understanding and agreed to proceed.  Vital Signs: Because this visit was a virtual/telehealth visit, some criteria may be missing or patient reported. Any vitals not documented were not able to be obtained and vitals that have been documented are patient reported.  VideoDeclined- This patient declined Librarian, academic. Therefore the visit was completed with audio only.  Persons Participating in Visit: Patient.  AWV Questionnaire: No: Patient Medicare AWV questionnaire was not completed prior to this visit.  Cardiac Risk Factors include: advanced age (>22men, >64 women);dyslipidemia;male gender     Objective:     There were no vitals filed for this visit. There is no height or weight on file to calculate BMI.     02/01/2024    3:17 PM 01/31/2023    3:51 PM 12/30/2016    9:13 AM 02/14/2015   11:30 AM  Advanced Directives  Does Patient Have a Medical Advance Directive? No No No No  Would patient like information on creating a medical advance directive? No - Patient declined       Current Medications (verified) Outpatient Encounter Medications as of 02/01/2024  Medication Sig   omeprazole (PRILOSEC) 20 MG capsule Take 20 mg by mouth daily. prn   sildenafil  (REVATIO ) 20 MG tablet TAKE  1-5 TABLETS BY MOUTH DAILY AS NEEDED   valACYclovir  (VALTREX ) 500 MG tablet Take 1 tablet (500 mg total) by mouth 2 (two) times daily.   rosuvastatin  (CRESTOR ) 5 MG tablet Take 1 tablet (5 mg total) by mouth daily. COME TO OFFICE FOR LAB WORK BEFORE ADDITIONAL REFILLS (Patient not taking: Reported on 02/01/2024)   No facility-administered encounter medications on file as of 02/01/2024.    Allergies (verified) Azithromycin, Doxycycline , Penicillins, Prednisone , and Wellbutrin [bupropion]   History: Past Medical History:  Diagnosis Date   Balanitis    Bronchitis    Heartburn    Sleep apnea    Past Surgical History:  Procedure Laterality Date   TONSILLECTOMY AND ADENOIDECTOMY     Family History  Problem Relation Age of Onset   Heart disease Mother    Lymphoma Mother    Ovarian cancer Mother    Prostate cancer Father    Colon polyps Maternal Grandmother    Prostate cancer Maternal Grandfather    Heart attack Paternal Grandfather    Kidney disease Neg Hx    Social History   Socioeconomic History   Marital status: Married    Spouse name: Not on file   Number of children: Not on file   Years of education: Not on file   Highest education level: Not on file  Occupational History   Not on file  Tobacco Use   Smoking status: Never   Smokeless tobacco: Never  Vaping Use   Vaping status: Never Used  Substance and Sexual Activity   Alcohol use: Yes  Alcohol/week: 0.0 standard drinks of alcohol    Comment: OCCASIONALLY   Drug use: No   Sexual activity: Never  Other Topics Concern   Not on file  Social History Narrative   Not on file   Social Drivers of Health   Financial Resource Strain: Low Risk  (02/01/2024)   Overall Financial Resource Strain (CARDIA)    Difficulty of Paying Living Expenses: Not hard at all  Food Insecurity: No Food Insecurity (02/01/2024)   Hunger Vital Sign    Worried About Running Out of Food in the Last Year: Never true    Ran Out of Food in the  Last Year: Never true  Transportation Needs: No Transportation Needs (02/01/2024)   PRAPARE - Administrator, Civil Service (Medical): No    Lack of Transportation (Non-Medical): No  Physical Activity: Insufficiently Active (02/01/2024)   Exercise Vital Sign    Days of Exercise per Week: 2 days    Minutes of Exercise per Session: 30 min  Stress: No Stress Concern Present (02/01/2024)   [employer] of Occupational Health - Occupational Stress Questionnaire    Feeling of Stress : Not at all  Social Connections: Moderately Integrated (02/01/2024)   Social Connection and Isolation Panel [NHANES]    Frequency of Communication with Friends and Family: More than three times a week    Frequency of Social Gatherings with Friends and Family: Three times a week    Attends Religious Services: Never    Active Member of Clubs or Organizations: Yes    Attends Engineer, structural: More than 4 times per year    Marital Status: Married    Tobacco Counseling Counseling given: Not Answered    Clinical Intake:  Pre-visit preparation completed: Yes  Pain : No/denies pain     BMI - recorded: 27.3 Nutritional Status: BMI 25 -29 Overweight Nutritional Risks: None Diabetes: No  No results found for: "HGBA1C"   How often do you need to have someone help you when you read instructions, pamphlets, or other written materials from your doctor or pharmacy?: 1 - Never  Interpreter Needed?: No  Information entered by :: Dellie Fergusson, LPN   Activities of Daily Living     02/01/2024    3:19 PM  In your present state of health, do you have any difficulty performing the following activities:  Hearing? 0  Vision? 0  Difficulty concentrating or making decisions? 0  Walking or climbing stairs? 0  Dressing or bathing? 0  Doing errands, shopping? 0  Preparing Food and eating ? N  Using the Toilet? N  In the past six months, have you accidently leaked urine? N  Do you have  problems with loss of bowel control? N  Managing your Medications? N  Managing your Finances? N  Housekeeping or managing your Housekeeping? N    Patient Care Team: Lamon Pillow, MD as PCP - General (Family Medicine) [place], [facility] (Ophthalmology) Jinnie Mountain Waunita Haff (Physician Assistant) [place], [facility])  I have updated your Care Teams any recent Medical Services you may have received from other providers in the past year.     Assessment:    This is a routine wellness examination for [facility].  Hearing/Vision screen Hearing Screening - Comments:: NO AIDS Vision Screening - Comments:: WEARS GLASSES ALL DAY- [facility]   Goals Addressed             This Visit's Progress    DIET -  EAT MORE FRUITS AND VEGETABLES         Depression Screen     02/01/2024    3:15 PM 05/06/2023    8:30 AM 01/31/2023    3:47 PM 04/28/2022    9:24 AM 10/15/2021    3:02 PM 07/23/2020   10:49 AM 11/12/2019   11:32 AM  PHQ 2/9 Scores  PHQ - 2 Score 0 0 1 3 0 3 0  PHQ- 9 Score 0 0  3 0 3 0    Fall Risk     02/01/2024    3:18 PM 05/06/2023    8:30 AM 01/31/2023    3:44 PM 04/28/2022    9:24 AM 10/15/2021    3:02 PM  Fall Risk   Falls in the past year? 0 0 1 0 0  Number falls in past yr: 0 0 0 0   Injury with Fall? 0 0 0 0   Risk for fall due to : No Fall Risks  No Fall Risks No Fall Risks   Follow up Falls evaluation completed  Education provided;Falls prevention discussed Falls evaluation completed     MEDICARE RISK AT HOME:  Medicare Risk at Home Any stairs in or around the home?: Yes If so, are there any without handrails?: No Home free of loose throw rugs in walkways, pet beds, electrical cords, etc?: Yes Adequate lighting in your home to reduce risk of falls?: Yes Life alert?: No Use of a cane, walker or w/c?: No Grab bars in the bathroom?: Yes Shower chair or bench in shower?: No Elevated toilet seat or a handicapped toilet?: No  TIMED UP AND  GO:  Was the test performed?  No  Cognitive Function: 6CIT completed        02/01/2024    3:20 PM 01/31/2023    4:00 PM  6CIT Screen  What Year? 0 points 0 points  What month? 0 points 0 points  What time? 0 points 0 points  Count back from 20 0 points 0 points  Months in reverse 0 points 0 points  Repeat phrase 0 points 2 points  Total Score 0 points 2 points    Immunizations Immunization History  Administered Date(s) Administered   Influenza Whole 06/14/2017   Influenza,inj,Quad PF,6+ Mos 06/14/2017, 06/14/2018   PNEUMOCOCCAL CONJUGATE-20 02/16/2021, 08/02/2022   Td 09/11/2004   Td (Adult),unspecified 04/17/1985, 09/11/2004   Tdap 10/10/2014, 06/13/2017, 06/14/2017   Zoster Recombinant(Shingrix) 06/14/2018    Screening Tests Health Maintenance  Topic Date Due   COVID-19 Vaccine (1) Never done   Zoster Vaccines- Shingrix (2 of 2) 08/09/2018   Colonoscopy  02/15/2022   INFLUENZA VACCINE  03/30/2024   Medicare Annual Wellness (AWV)  01/31/2025   DTaP/Tdap/Td (6 - Td or Tdap) 06/15/2027   Pneumonia Vaccine 75+ Years old  Completed   Hepatitis C Screening  Completed   HPV VACCINES  Aged Out   Meningococcal B Vaccine  Aged Out    Health Maintenance  Health Maintenance Due  Topic Date Due   COVID-19 Vaccine (1) Never done   Zoster Vaccines- Shingrix (2 of 2) 08/09/2018   Colonoscopy  02/15/2022   Health Maintenance Items Addressed: Referral sent to GI for colonoscopy; UP TO DATE PNA & TDAP, WANTS NO COVID SHOTS  Additional Screening:  Vision Screening: Recommended annual ophthalmology exams for early detection of glaucoma and other disorders of the eye. Would you like a referral to an eye doctor? No    Dental Screening: Recommended annual dental  exams for proper oral hygiene  Community Resource Referral / Chronic Care Management: CRR required this visit?  No   CCM required this visit?  No   Plan:    I have personally reviewed and noted the following  in the patient's chart:   Medical and social history Use of alcohol, tobacco or illicit drugs  Current medications and supplements including opioid prescriptions. Patient is not currently taking opioid prescriptions. Functional ability and status Nutritional status Physical activity Advanced directives List of other physicians Hospitalizations, surgeries, and ER visits in previous 12 months Vitals Screenings to include cognitive, depression, and falls Referrals and appointments  In addition, I have reviewed and discussed with patient certain preventive protocols, quality metrics, and best practice recommendations. A written personalized care plan for preventive services as well as general preventive health recommendations were provided to patient.   Pinky Bright, LPN   12/01/345   After Visit Summary: (MyChart) Due to this being a telephonic visit, the after visit summary with patients personalized plan was offered to patient via MyChart   Notes: COLONOSCOPY ORDERED

## 2024-02-01 NOTE — Patient Instructions (Addendum)
 [name] , Thank you for taking time out of your busy schedule to complete your Annual Wellness Visit with me. I enjoyed our conversation and look forward to speaking with you again next year. I, as well as your care team,  appreciate your ongoing commitment to your health goals. Please review the following plan we discussed and let me know if I can assist you in the future.   Follow up Visits: Next Medicare AWV with our clinical staff:   02/12/25 @ 2:30 pm by phone Have you seen your provider in the last 6 months (3 months if uncontrolled diabetes)? Yes   Clinician Recommendations:  Aim for 30 minutes of exercise or brisk walking, 6-8 glasses of water, and 5 servings of fruits and vegetables each day. TAKE CARE!      This is a list of the screening recommended for you and due dates:  Health Maintenance  Topic Date Due   COVID-19 Vaccine (1) Never done   Zoster (Shingles) Vaccine (2 of 2) 08/09/2018   Colon Cancer Screening  02/15/2022   Flu Shot  03/30/2024   Medicare Annual Wellness Visit  01/31/2025   DTaP/Tdap/Td vaccine (6 - Td or Tdap) 06/15/2027   Pneumonia Vaccine  Completed   Hepatitis C Screening  Completed   HPV Vaccine  Aged Out   Meningitis B Vaccine  Aged Out    Advanced directives: (ACP Link)Information on Advanced Care Planning can be found at [employer] [facility] Advance Health Care Directives Advance Health Care Directives. [email or web address]  Advance Care Planning is important because it:  [x]  Makes sure you receive the medical care that is consistent with your values, goals, and preferences  [x]  It provides guidance to your family and loved ones and reduces their decisional burden about whether or not they are making the right decisions based on your wishes.  Follow the link provided in your after visit summary or read over the paperwork we have mailed to you to help you started getting your Advance Directives in place. If you need assistance in completing  these, please reach out to us  so that we can help you!

## 2024-04-19 DIAGNOSIS — K219 Gastro-esophageal reflux disease without esophagitis: Secondary | ICD-10-CM | POA: Diagnosis not present

## 2024-04-19 DIAGNOSIS — R1310 Dysphagia, unspecified: Secondary | ICD-10-CM | POA: Diagnosis not present

## 2024-04-19 DIAGNOSIS — Z860101 Personal history of adenomatous and serrated colon polyps: Secondary | ICD-10-CM | POA: Diagnosis not present

## 2024-04-27 DIAGNOSIS — M26609 Unspecified temporomandibular joint disorder, unspecified side: Secondary | ICD-10-CM | POA: Diagnosis not present

## 2024-04-27 DIAGNOSIS — H9313 Tinnitus, bilateral: Secondary | ICD-10-CM | POA: Diagnosis not present

## 2024-04-27 DIAGNOSIS — H903 Sensorineural hearing loss, bilateral: Secondary | ICD-10-CM | POA: Diagnosis not present

## 2024-04-27 DIAGNOSIS — H6123 Impacted cerumen, bilateral: Secondary | ICD-10-CM | POA: Diagnosis not present

## 2024-05-14 DIAGNOSIS — E663 Overweight: Secondary | ICD-10-CM | POA: Diagnosis not present

## 2024-05-14 DIAGNOSIS — K219 Gastro-esophageal reflux disease without esophagitis: Secondary | ICD-10-CM | POA: Diagnosis not present

## 2024-05-14 DIAGNOSIS — E785 Hyperlipidemia, unspecified: Secondary | ICD-10-CM | POA: Diagnosis not present

## 2024-05-14 DIAGNOSIS — E221 Hyperprolactinemia: Secondary | ICD-10-CM | POA: Diagnosis not present

## 2024-05-14 DIAGNOSIS — Z008 Encounter for other general examination: Secondary | ICD-10-CM | POA: Diagnosis not present

## 2024-05-14 DIAGNOSIS — Z6827 Body mass index (BMI) 27.0-27.9, adult: Secondary | ICD-10-CM | POA: Diagnosis not present

## 2024-05-18 DIAGNOSIS — K2100 Gastro-esophageal reflux disease with esophagitis, without bleeding: Secondary | ICD-10-CM | POA: Diagnosis not present

## 2024-05-18 DIAGNOSIS — Z0000 Encounter for general adult medical examination without abnormal findings: Secondary | ICD-10-CM | POA: Diagnosis not present

## 2024-05-18 DIAGNOSIS — E782 Mixed hyperlipidemia: Secondary | ICD-10-CM | POA: Diagnosis not present

## 2024-05-18 DIAGNOSIS — Z1389 Encounter for screening for other disorder: Secondary | ICD-10-CM | POA: Diagnosis not present

## 2024-05-18 DIAGNOSIS — Z125 Encounter for screening for malignant neoplasm of prostate: Secondary | ICD-10-CM | POA: Diagnosis not present

## 2024-05-18 DIAGNOSIS — N401 Enlarged prostate with lower urinary tract symptoms: Secondary | ICD-10-CM | POA: Diagnosis not present

## 2024-05-18 DIAGNOSIS — Z136 Encounter for screening for cardiovascular disorders: Secondary | ICD-10-CM | POA: Diagnosis not present

## 2024-05-18 DIAGNOSIS — G4733 Obstructive sleep apnea (adult) (pediatric): Secondary | ICD-10-CM | POA: Diagnosis not present

## 2024-05-18 DIAGNOSIS — R3912 Poor urinary stream: Secondary | ICD-10-CM | POA: Diagnosis not present

## 2024-05-18 DIAGNOSIS — Z1331 Encounter for screening for depression: Secondary | ICD-10-CM | POA: Diagnosis not present

## 2024-05-18 DIAGNOSIS — R7989 Other specified abnormal findings of blood chemistry: Secondary | ICD-10-CM | POA: Diagnosis not present

## 2024-05-21 ENCOUNTER — Other Ambulatory Visit: Payer: Self-pay | Admitting: Family Medicine

## 2024-05-21 DIAGNOSIS — Z136 Encounter for screening for cardiovascular disorders: Secondary | ICD-10-CM

## 2024-05-21 DIAGNOSIS — Z0000 Encounter for general adult medical examination without abnormal findings: Secondary | ICD-10-CM

## 2024-05-25 ENCOUNTER — Ambulatory Visit

## 2024-05-25 DIAGNOSIS — Z860101 Personal history of adenomatous and serrated colon polyps: Secondary | ICD-10-CM | POA: Diagnosis not present

## 2024-05-25 DIAGNOSIS — K317 Polyp of stomach and duodenum: Secondary | ICD-10-CM | POA: Diagnosis not present

## 2024-05-25 DIAGNOSIS — K222 Esophageal obstruction: Secondary | ICD-10-CM | POA: Diagnosis not present

## 2024-05-25 DIAGNOSIS — Z1211 Encounter for screening for malignant neoplasm of colon: Secondary | ICD-10-CM | POA: Diagnosis not present

## 2024-05-25 DIAGNOSIS — K640 First degree hemorrhoids: Secondary | ICD-10-CM | POA: Diagnosis not present

## 2024-05-25 DIAGNOSIS — Z09 Encounter for follow-up examination after completed treatment for conditions other than malignant neoplasm: Secondary | ICD-10-CM | POA: Diagnosis not present

## 2024-05-25 DIAGNOSIS — K449 Diaphragmatic hernia without obstruction or gangrene: Secondary | ICD-10-CM | POA: Diagnosis not present

## 2024-05-28 ENCOUNTER — Inpatient Hospital Stay: Admission: RE | Admit: 2024-05-28 | Source: Ambulatory Visit

## 2024-11-27 ENCOUNTER — Ambulatory Visit: Admitting: Dermatology

## 2025-02-12 ENCOUNTER — Ambulatory Visit
